# Patient Record
Sex: Female | Born: 1965 | Race: White | Hispanic: No | Marital: Single | Smoking: Former smoker
Health system: Southern US, Community
[De-identification: ages and names within clinical notes are randomized; demographics above are authoritative.]

## PROBLEM LIST (undated history)

## (undated) DIAGNOSIS — F909 Attention-deficit hyperactivity disorder, unspecified type: Secondary | ICD-10-CM

## (undated) DIAGNOSIS — F329 Major depressive disorder, single episode, unspecified: Secondary | ICD-10-CM

## (undated) DIAGNOSIS — F32A Depression, unspecified: Secondary | ICD-10-CM

## (undated) DIAGNOSIS — F319 Bipolar disorder, unspecified: Secondary | ICD-10-CM

## (undated) DIAGNOSIS — Q230 Congenital stenosis of aortic valve: Secondary | ICD-10-CM

## (undated) DIAGNOSIS — N841 Polyp of cervix uteri: Secondary | ICD-10-CM

## (undated) HISTORY — DX: Depression, unspecified: F32.A

## (undated) HISTORY — DX: Attention-deficit hyperactivity disorder, unspecified type: F90.9

## (undated) HISTORY — DX: Major depressive disorder, single episode, unspecified: F32.9

## (undated) HISTORY — PX: CARDIAC VALVE REPLACEMENT: SHX585

## (undated) HISTORY — DX: Polyp of cervix uteri: N84.1

---

## 1996-06-04 HISTORY — PX: CARDIAC VALVE REPLACEMENT: SHX585

## 2011-01-10 ENCOUNTER — Other Ambulatory Visit: Payer: Self-pay | Admitting: Geriatric Medicine

## 2011-01-10 ENCOUNTER — Ambulatory Visit
Admission: RE | Admit: 2011-01-10 | Discharge: 2011-01-10 | Disposition: A | Discharge status: Home / Self Care | Payer: 59 | Source: Ambulatory Visit | Attending: Geriatric Medicine | Admitting: Geriatric Medicine

## 2011-01-10 DIAGNOSIS — M545 Low back pain, unspecified: Secondary | ICD-10-CM

## 2012-09-11 ENCOUNTER — Encounter: Payer: Self-pay | Admitting: Obstetrics and Gynecology

## 2012-09-11 ENCOUNTER — Other Ambulatory Visit (HOSPITAL_COMMUNITY)
Admission: RE | Admit: 2012-09-11 | Discharge: 2012-09-11 | Disposition: A | Discharge status: Home / Self Care | Payer: Self-pay | Source: Ambulatory Visit | Attending: Obstetrics and Gynecology | Admitting: Obstetrics and Gynecology

## 2012-09-11 ENCOUNTER — Ambulatory Visit (INDEPENDENT_AMBULATORY_CARE_PROVIDER_SITE_OTHER): Payer: Self-pay | Admitting: Obstetrics and Gynecology

## 2012-09-11 VITALS — BP 119/70 | HR 88 | Temp 97.5°F | Ht 67.0 in | Wt 169.9 lb

## 2012-09-11 DIAGNOSIS — Z01812 Encounter for preprocedural laboratory examination: Secondary | ICD-10-CM

## 2012-09-11 DIAGNOSIS — N841 Polyp of cervix uteri: Secondary | ICD-10-CM | POA: Insufficient documentation

## 2012-09-11 NOTE — Progress Notes (Signed)
Patient ID: Christina Mccarty, female   DOB: [birth year], 47 y.o.   MRN: [id number] 47 yo presenting today for evaluation of cervical polyp. Patient underwent a free cervical cancer screening where a cervical polyp was visualized. Patient reports irregular cycles where she often skips months. Patient is otherwise without any complaints  GENERAL: Well-developed, well-nourished female in no acute distress.  PELVIC: Normal external female genitalia. Vagina is pink and rugated.  Normal discharge. Normal appearing cervix with 1cm polypoid tissue extending from cervical os. Uterus is normal in size. No adnexal mass or tenderness. EXTREMITIES: No cyanosis, clubbing, or edema, 2+ distal pulses.  A/P 47 yo with cervical polyp  - After informed consent was obtained and with a negative pregnancy test, the patient was placed in dorsalithatomy position and the cervix was prepped with betadine. The cervical polyp was grasped with a ring forceps. The specimen was removed after a series of rotation with traction. The patient tolerated the procedure well. Patient will be contacted with any abnormal results

## 2012-11-28 ENCOUNTER — Telehealth: Payer: Self-pay | Admitting: *Deleted

## 2012-11-28 NOTE — Telephone Encounter (Signed)
Patient left a message stating she had a procedure last April she wants results. Also wants a copy mailed to her.

## 2012-12-04 NOTE — Telephone Encounter (Signed)
Called Christina Mccarty and left a message we are returning her call and will call once more. Per chart review had cervical polyp removed 09/11/12 and per pathology was benign.  Dr. Deretha Emory notes said will call if results abnormal.

## 2012-12-08 ENCOUNTER — Encounter: Payer: Self-pay | Admitting: *Deleted

## 2012-12-08 NOTE — Telephone Encounter (Signed)
Christina Mccarty called and left a message she had a procedure 09/11/12 and has not received any pathology results and this is second time she has called. States someone called her today but she was not available and she called back and was cut off so is leaving a message

## 2012-12-08 NOTE — Telephone Encounter (Signed)
Called Wisdom back and informed her usually we do not call unless results abnormal and that her polyp was benign.  Ramanda requests copy sent to her of polyp results- she gave phone consent to 2 nurses and informed her will send results.

## 2013-02-03 ENCOUNTER — Encounter: Payer: Self-pay | Admitting: *Deleted

## 2016-03-28 ENCOUNTER — Emergency Department: Admission: EM | Admit: 2016-03-28 | Discharge: 2016-03-28 | Discharge status: Against Medical Advice | Payer: Self-pay

## 2016-04-08 ENCOUNTER — Encounter (HOSPITAL_COMMUNITY): Payer: Self-pay

## 2016-04-08 ENCOUNTER — Emergency Department (HOSPITAL_COMMUNITY): Payer: Self-pay

## 2016-04-08 ENCOUNTER — Emergency Department (HOSPITAL_COMMUNITY): Admission: EM | Admit: 2016-04-08 | Discharge: 2016-04-08 | Discharge status: Against Medical Advice | Payer: Self-pay

## 2016-04-08 ENCOUNTER — Emergency Department (HOSPITAL_COMMUNITY)
Admission: EM | Admit: 2016-04-08 | Discharge: 2016-04-09 | Disposition: A | Discharge status: Home / Self Care | Payer: Federal, State, Local not specified - Other | Attending: Emergency Medicine | Admitting: Emergency Medicine

## 2016-04-08 ENCOUNTER — Encounter (HOSPITAL_COMMUNITY): Payer: Self-pay | Admitting: Emergency Medicine

## 2016-04-08 DIAGNOSIS — Z955 Presence of coronary angioplasty implant and graft: Secondary | ICD-10-CM | POA: Insufficient documentation

## 2016-04-08 DIAGNOSIS — F909 Attention-deficit hyperactivity disorder, unspecified type: Secondary | ICD-10-CM | POA: Insufficient documentation

## 2016-04-08 DIAGNOSIS — F319 Bipolar disorder, unspecified: Secondary | ICD-10-CM

## 2016-04-08 DIAGNOSIS — Z79899 Other long term (current) drug therapy: Secondary | ICD-10-CM | POA: Insufficient documentation

## 2016-04-08 DIAGNOSIS — F419 Anxiety disorder, unspecified: Secondary | ICD-10-CM | POA: Insufficient documentation

## 2016-04-08 DIAGNOSIS — Z87891 Personal history of nicotine dependence: Secondary | ICD-10-CM | POA: Insufficient documentation

## 2016-04-08 HISTORY — DX: Bipolar disorder, unspecified: F31.9

## 2016-04-08 LAB — URINALYSIS, ROUTINE W REFLEX MICROSCOPIC
Bilirubin Urine: NEGATIVE
GLUCOSE, UA: NEGATIVE mg/dL
Hgb urine dipstick: NEGATIVE
KETONES UR: 15 mg/dL — AB
Nitrite: NEGATIVE
PH: 5.5 (ref 5.0–8.0)
Protein, ur: NEGATIVE mg/dL
Specific Gravity, Urine: 1.011 (ref 1.005–1.030)

## 2016-04-08 LAB — BASIC METABOLIC PANEL
Anion gap: 13 (ref 5–15)
BUN: 12 mg/dL (ref 6–20)
CALCIUM: 10.1 mg/dL (ref 8.9–10.3)
CO2: 20 mmol/L — ABNORMAL LOW (ref 22–32)
CREATININE: 0.77 mg/dL (ref 0.44–1.00)
Chloride: 105 mmol/L (ref 101–111)
GFR calc Af Amer: >60 mL/min (ref >60)
Glucose, Bld: 115 mg/dL — ABNORMAL HIGH (ref 65–99)
Potassium: 3.2 mmol/L — ABNORMAL LOW (ref 3.5–5.1)
SODIUM: 138 mmol/L (ref 135–145)

## 2016-04-08 LAB — CBC
HCT: 40.3 % (ref 36.0–46.0)
Hemoglobin: 14.1 g/dL (ref 12.0–15.0)
MCH: 31.8 pg (ref 26.0–34.0)
MCHC: 35 g/dL (ref 30.0–36.0)
MCV: 90.8 fL (ref 78.0–100.0)
PLATELETS: 224 10*3/uL (ref 150–400)
RBC: 4.44 MIL/uL (ref 3.87–5.11)
RDW: 12.1 % (ref 11.5–15.5)
WBC: 5.9 10*3/uL (ref 4.0–10.5)

## 2016-04-08 LAB — RAPID URINE DRUG SCREEN, HOSP PERFORMED
Amphetamines: POSITIVE — AB
BARBITURATES: NOT DETECTED
BENZODIAZEPINES: NOT DETECTED
Cocaine: NOT DETECTED
Opiates: NOT DETECTED
Tetrahydrocannabinol: POSITIVE — AB

## 2016-04-08 LAB — URINE MICROSCOPIC-ADD ON: RBC / HPF: NONE SEEN RBC/hpf (ref 0–5)

## 2016-04-08 LAB — I-STAT TROPONIN, ED: TROPONIN I, POC: 0.02 ng/mL (ref 0.00–0.08)

## 2016-04-08 LAB — POC URINE PREG, ED: PREG TEST UR: NEGATIVE

## 2016-04-08 LAB — SALICYLATE LEVEL

## 2016-04-08 LAB — ACETAMINOPHEN LEVEL: Acetaminophen (Tylenol), Serum: <10 ug/mL — ABNORMAL LOW (ref 10–30)

## 2016-04-08 LAB — ETHANOL: Alcohol, Ethyl (B): <5 mg/dL (ref <5)

## 2016-04-08 MED ORDER — FLUOXETINE HCL 20 MG PO CAPS
40.0000 mg | ORAL_CAPSULE | Freq: Every day | ORAL | Status: DC
  Administered 2016-04-09: 40 mg via ORAL
  Filled 2016-04-08 (×2): qty 2

## 2016-04-08 MED ORDER — POTASSIUM CHLORIDE CRYS ER 20 MEQ PO TBCR
40.0000 meq | EXTENDED_RELEASE_TABLET | Freq: Once | ORAL | Status: AC
  Administered 2016-04-08: 40 meq via ORAL
  Filled 2016-04-08: qty 2

## 2016-04-08 MED ORDER — SODIUM CHLORIDE 0.9 % IV BOLUS (SEPSIS)
1000.0000 mL | Freq: Once | INTRAVENOUS | Status: AC
  Administered 2016-04-08: 1000 mL via INTRAVENOUS

## 2016-04-08 MED ORDER — LORAZEPAM 2 MG/ML IJ SOLN
1.0000 mg | Freq: Once | INTRAMUSCULAR | Status: AC
  Administered 2016-04-08: 1 mg via INTRAVENOUS
  Filled 2016-04-08: qty 1

## 2016-04-08 MED ORDER — HALOPERIDOL LACTATE 5 MG/ML IJ SOLN
5.0000 mg | Freq: Once | INTRAMUSCULAR | Status: AC
  Administered 2016-04-08: 5 mg via INTRAVENOUS
  Filled 2016-04-08: qty 1

## 2016-04-08 MED ORDER — AMPHETAMINE-DEXTROAMPHETAMINE 10 MG PO TABS
20.0000 mg | ORAL_TABLET | Freq: Two times a day (BID) | ORAL | Status: DC
  Administered 2016-04-09: 20 mg via ORAL
  Filled 2016-04-08: qty 2

## 2016-04-08 NOTE — ED Notes (Signed)
Pt arrived to C25 via stretcher. Belongings placed at nurses' desk for 1 labeled belongings bag for inventory.

## 2016-04-08 NOTE — Progress Notes (Signed)
CSW completed patient referrals to:  [facility]  [facility] SpeckLeo Jayston [facility] NavarroCSW,LCAS [facility] Disposition CSW [phone]

## 2016-04-08 NOTE — ED Notes (Signed)
Pt awakened approx 30 min ago, used Rest Room without difficulty.  Pt told me she doesn't understand what is happening and why.  Mentioned her sister had her committed.  Asking to speak with an attorney.  Explained we did not have one present in the ED. Stated she lives with her mother, has not been able to find a job since she quit her job "a few years ago"   Is tearful, says she has been having bad nightmares for weeks and that someone is coming in her house and hurting her.

## 2016-04-08 NOTE — ED Notes (Signed)
TTS being attempted.  Pt very sleepy but awakens to voice.  States she is able to perform TTS as requested.  Interviewer up on TTS for pt.

## 2016-04-08 NOTE — ED Notes (Signed)
Dr Ethelda ChickJacubowitz completed 1st exam paperwork for IVC - [facility] to [facility], and original placed in folder for magistrate.

## 2016-04-08 NOTE — ED Notes (Signed)
Pt's sister visiting w/pt. 

## 2016-04-08 NOTE — ED Provider Notes (Signed)
[facility]PT Provider Note   CSN: [id number] Arrival date & time: 04/08/16  0235     History   Chief Complaint Chief Complaint  Patient presents with  . Chest Pain  . Drug Overdose    HPI Christina Mccarty is a 50 y.o. female PMH of ADHD and depression here with acute anxiety.  Patient states she is feeling like someone is going to kill her.  She took oxycodone an adderall trying to treat her symptoms without any relief.  She denies any SI or HI.  No other drugs or alcohol.  She continues to scream out for someone to give her ativan to calm her down.    10 Systems reviewed and are negative for acute change except as noted in the HPI.   HPI  Past Medical History:  Diagnosis Date  . ADHD (attention deficit hyperactivity disorder)   . Cervical polyp   . Depression     Patient Active Problem List   Diagnosis Date Noted  . Polyp at cervical os 09/11/2012    Past Surgical History:  Procedure Laterality Date  . CARDIAC VALVE REPLACEMENT  1998    OB History    No data available       Home Medications    Prior to Admission medications   Medication Sig Start Date End Date Taking? Authorizing Provider  amphetamine-dextroamphetamine (ADDERALL) 20 MG tablet Take 20 mg by mouth 2 (two) times daily.    Historical Provider, MD  FLUoxetine (PROZAC) 40 MG capsule Take 40 mg by mouth daily.    Historical Provider, MD  ibuprofen (ADVIL,MOTRIN) 200 MG tablet Take 200 mg by mouth every 6 (six) hours as needed for pain.    Historical Provider, MD  lisinopril (PRINIVIL,ZESTRIL) 10 MG tablet Take 10 mg by mouth daily.    Historical Provider, MD  loratadine (CLARITIN) 10 MG tablet Take 10 mg by mouth daily.    Historical Provider, MD    Family History Family History  Problem Relation Age of Onset  . Heart disease Father   . Other Father     tumor on thymus removed    Social History Social History  Substance Use Topics  . Smoking status: Former Smoker    Types:  Cigarettes  . Smokeless tobacco: Never Used     Comment: Stopped smoking age 50  . Alcohol use Yes     Comment: 3 glasses daily     Allergies   Patient has no known allergies.   Review of Systems Review of Systems   Physical Exam Updated Vital Signs BP 107/60   Pulse 84   Temp 98 F (36.7 C)   Resp 18   SpO2 97%   Physical Exam  Constitutional: She is oriented to person, place, and time. She appears well-developed and well-nourished. No distress.  HENT:  Head: Normocephalic and atraumatic.  Nose: Nose normal.  Mouth/Throat: Oropharynx is clear and moist. No oropharyngeal exudate.  Eyes: Conjunctivae and EOM are normal. Pupils are equal, round, and reactive to light. No scleral icterus.  Neck: Normal range of motion. Neck supple. No JVD present. No tracheal deviation present. No thyromegaly present.  Cardiovascular: Regular rhythm and normal heart sounds.  Exam reveals no gallop and no friction rub.   No murmur heard. Tachycardic   Pulmonary/Chest: Effort normal and breath sounds normal. No respiratory distress. She has no wheezes. She exhibits no tenderness.  Abdominal: Soft. Bowel sounds are normal. She exhibits no distension and no mass. There is no  tenderness. There is no rebound and no guarding.  Musculoskeletal: Normal range of motion. She exhibits no edema or tenderness.  Lymphadenopathy:    She has no cervical adenopathy.  Neurological: She is alert and oriented to person, place, and time. No cranial nerve deficit. She exhibits normal muscle tone.  Skin: Skin is warm and dry. No rash noted. No erythema. No pallor.  Psychiatric:  Patient is irrational, screaming throughout the ED, demanding ativan.    Nursing note and vitals reviewed.    ED Treatments / Results  Labs (all labs ordered are listed, but only abnormal results are displayed) Labs Reviewed  BASIC METABOLIC PANEL - Abnormal; Notable for the following:       Result Value   Potassium 3.2 (*)     CO2 20 (*)    Glucose, Bld 115 (*)    All other components within normal limits  ACETAMINOPHEN LEVEL - Abnormal; Notable for the following:    Acetaminophen (Tylenol), Serum <10 (*)    All other components within normal limits  URINALYSIS, ROUTINE W REFLEX MICROSCOPIC (NOT AT [facility]) - Abnormal; Notable for the following:    APPearance CLOUDY (*)    Ketones, ur 15 (*)    Leukocytes, UA LARGE (*)    All other components within normal limits  RAPID URINE DRUG SCREEN, HOSP PERFORMED - Abnormal; Notable for the following:    Amphetamines POSITIVE (*)    Tetrahydrocannabinol POSITIVE (*)    All other components within normal limits  URINE MICROSCOPIC-ADD ON - Abnormal; Notable for the following:    Squamous Epithelial / LPF 0-5 (*)    Bacteria, UA RARE (*)    Casts HYALINE CASTS (*)    All other components within normal limits  CBC  SALICYLATE LEVEL  ETHANOL  I-STAT TROPOININ, ED  POC URINE PREG, ED    EKG  EKG Interpretation  Date/Time:  Sunday April 08 2016 03:22:07 EST Ventricular Rate:  127 PR Interval:  182 QRS Duration: 128 QT Interval:  306 QTC Calculation: 444 R Axis:   58 Text Interpretation:  Sinus tachycardia Biatrial enlargement Non-specific intra-ventricular conduction block Nonspecific T wave abnormality Abnormal ECG No significant change since last tracing Confirmed by Erroll Luna [phone]) on 04/08/2016 3:31:11 AM       Radiology Dg Chest Portable 1 View  Result Date: 04/08/2016 CLINICAL DATA:  Chest pain.  Hallucinations.  One day duration. EXAM: PORTABLE CHEST 1 VIEW COMPARISON:  None. FINDINGS: Mild cardiomegaly. The lungs are clear. The pulmonary vasculature is normal. Hilar and mediastinal contours are unremarkable. No large effusions. IMPRESSION: Mild cardiomegaly. Electronically Signed   By: Ellery Plunk M.D.   On: 04/08/2016 04:53    Procedures Procedures (including critical care time)  Medications Ordered in ED Medications  sodium  chloride 0.9 % bolus 1,000 mL (0 mLs Intravenous Stopped 04/08/16 0622)  LORazepam (ATIVAN) injection 1 mg (1 mg Intravenous Given 04/08/16 0406)  haloperidol lactate (HALDOL) injection 5 mg (5 mg Intravenous Given 04/08/16 0406)  potassium chloride SA (K-DUR,KLOR-CON) CR tablet 40 mEq (40 mEq Oral Given 04/08/16 6045)     Initial Impression / Assessment and Plan / ED Course  I have reviewed the triage vital signs and the nursing notes.  Pertinent labs & imaging results that were available during my care of the patient were reviewed by me and considered in my medical decision making (see chart for details).  Clinical Course     Patient presents to the ED for acute psychosis  and anxiety. She was given haldol and ativan as she could not be effectively redirected.  IVf given as well.  Labs pending for medical clearance.  Potassium was replaced.   Patient is medically clear and ready for TTS consultation.  She is safe for move to Pod C as well.  Final Clinical Impressions(s) / ED Diagnoses   Final diagnoses:  None    New Prescriptions New Prescriptions   No medications on file     Tomasita CrumbleAdeleke Cortney Mckinney, MD 04/08/16 [phone]

## 2016-04-08 NOTE — ED Triage Notes (Signed)
Pt comes c/o of CP, pt is very anxious and crying, hyperventilating and scared. States CP has been going on for several week, L sided, pt states that she took some of her moms oxycodone of unknown amount and unknown amount of adderall, because someone has been threating to harm her. Denies SI/HI, pt is delusional and states someone is drugging her.

## 2016-04-08 NOTE — BH Assessment (Addendum)
Tele Assessment Note   Christina Mccarty is a Caucasian 50 y.o. female [facility] by sister) with complaint that people were trying to kill her.  Per hospital notes, Pt complained that someone was trying to kill her, and she demanded Ativan to calm down.  Pt has a history of being treated for depression and Bipolar I.  Information gathered from Pt and Pt's sister.  Pt was groggy when assessed.  She reported that she was confused as to why she was at the hospital.  She admitted that she felt scared and was not sure if she should go home (Pt lives with her mother).  Pt denied suicidal or homicidal ideation, and when asked about auditory/visual hallucination, she shrugged.  Pt said she wanted to go home.  Author contacted Pt's sister, Christina Mccarty [phone]).  Christina Mccarty reported that for the past few weeks, Pt has been off her Lamictal and has been exhibiting increasingly bizarre and alarming behavior:  Recently, Pt has been calling police to complain that people are outside the house and are trying to kill her; per sister, Pt has been responding to internal stimuli and has been saying that a [place] actor is speaking with her; on 04/07/16, Pt intentionally more Adderall than prescribed in stated hope that she would have a heart attack and die.  Christina Mccarty also stated that Pt has struggled to sleep recently, cannot work, and has been taking large quantities of Adderall to calm self.  Christina Mccarty stated that she took Pt to an ED in late October for evaluation but Pt was discharged.  Christina Mccarty also stated that [place] [facility] [place] for similar symptoms.  Pt's UDS indicated the presence of marijuana.  When asked about marijuana use, Pt shrugged.  "I don't know why I'm here."    During assessment, Pt was groggy.  She had fair eye contact.  Demeanor was very guarded.  Pt was dressed in scrubs and appeared appropriately groomed.  Pt denied suicidal ideation; however,  sister stated that Pt intentionally took more Adderall than prescribed on 04/07/16 because she wanted to induce a heart attack.  Pt denied depressive symptoms.  However, Pt's sister stated that Pt has complained of not sleeping for about 1.5 weeks, and that Pt has been self-medicating with Adderall to sleep.  Pt endorsed substance use (UDS was positive for THC and amphetamines).  Pt's speech was slow and soft.  Thought processes were slowed; it may be that Pt was engaging in thought blocking.  Impulse control, insight, and judgment were deemed poor.  While Pt denied symptoms other than a general concern that someone might try to hurt her, Pt's sister reported that Pt has expressed suicidal ideation and has exhibited insomnia, impulsivity (taking more medication than prescribed), interaction with auditory hallucination, and delusion (believing that people are trying to kill her to the point that she has contacted police several times).  Consulted with Christina BurtonL. Parks, NP, who recommended inpatient treatment.  Diagnosis: Bipolar I  Past Medical History:  Past Medical History:  Diagnosis Date  . ADHD (attention deficit hyperactivity disorder)   . Bipolar disorder (HCC)   . Cervical polyp   . Depression     Past Surgical History:  Procedure Laterality Date  . CARDIAC VALVE REPLACEMENT  1998    Family History:  Family History  Problem Relation Age of Onset  . Heart disease Father   . Other Father     tumor  on thymus removed    Social History:  reports that she has quit smoking. Her smoking use included Cigarettes. She has never used smokeless tobacco. She reports that she drinks alcohol. She reports that she uses drugs, including Marijuana.  Additional Social History:     CIWA: CIWA-Ar BP: 107/60 Pulse Rate: 84 COWS:    PATIENT STRENGTHS: (choose at least two) Capable of independent living Supportive family/friends  Allergies: No Known Allergies  Home Medications:  (Not in a hospital  admission)  OB/GYN Status:  No LMP recorded.  General Assessment Data Admission Status: Involuntary           Risk to self with the past 6 months Is patient at risk for suicide?: Yes Substance abuse history and/or treatment for substance abuse?: Yes        Mental Status Report Motor Activity: Freedom of movement     ADLScreening [facility]) Patient's cognitive ability adequate to safely complete daily activities?: Yes Patient able to express need for assistance with ADLs?: Yes Independently performs ADLs?: Yes (appropriate for developmental age)        ADL Screening (condition at time of admission) Patient's cognitive ability adequate to safely complete daily activities?: Yes Is the patient deaf or have difficulty hearing?: No Does the patient have difficulty seeing, even when wearing glasses/contacts?: No Does the patient have difficulty concentrating, remembering, or making decisions?: No Patient able to express need for assistance with ADLs?: Yes Does the patient have difficulty dressing or bathing?: No Independently performs ADLs?: Yes (appropriate for developmental age) Does the patient have difficulty walking or climbing stairs?: No Weakness of Legs: None Weakness of Arms/Hands: None  Home Assistive Devices/Equipment Home Assistive Devices/Equipment: None  Therapy Consults (therapy consults require a physician order) PT Evaluation Needed: No OT Evalulation Needed: No SLP Evaluation Needed: No Abuse/Neglect Assessment (Assessment to be complete while patient is alone) Physical Abuse: Denies Verbal Abuse: Denies Sexual Abuse: Denies Exploitation of patient/patient's resources: Denies Self-Neglect: Denies Values / Beliefs Cultural Requests During Hospitalization: None Spiritual Requests During Hospitalization: None Consults Spiritual Care Consult Needed: No Social Work Consult Needed: No Merchant navy officerAdvance Directives (For Healthcare) Does  patient have an advance directive?: No Would patient like information on creating an advanced directive?: No - patient declined information          Disposition:     Earline Mayotteugene T Kleber Crean 04/08/2016 7:43 AM

## 2016-04-08 NOTE — ED Notes (Signed)
Sister, Antony OdeaLisa Saleh / [phone] ; going to try and visit patient at 5:15

## 2016-04-08 NOTE — ED Notes (Signed)
Bed: [id number] Expected date:  Expected time:  Means of arrival:  Comments: 49yo F

## 2016-04-08 NOTE — ED Notes (Signed)
Pt taking shower.  

## 2016-04-08 NOTE — ED Notes (Signed)
Per EMS- Pt states that she is seeing and hearing voices for x1.She is feeling like someone is going to hurt her or kill her. She said that something happened to her a week ago but did not explain what. Stated that she took handful of hydrocodone and 2 adderall. She is also saying that she has chest pain.

## 2016-04-08 NOTE — ED Notes (Signed)
Report given to Becky R.N.

## 2016-04-09 DIAGNOSIS — Z79899 Other long term (current) drug therapy: Secondary | ICD-10-CM

## 2016-04-09 DIAGNOSIS — Z87891 Personal history of nicotine dependence: Secondary | ICD-10-CM

## 2016-04-09 DIAGNOSIS — F319 Bipolar disorder, unspecified: Secondary | ICD-10-CM

## 2016-04-09 DIAGNOSIS — Z8249 Family history of ischemic heart disease and other diseases of the circulatory system: Secondary | ICD-10-CM

## 2016-04-09 MED ORDER — POTASSIUM CHLORIDE CRYS ER 20 MEQ PO TBCR
40.0000 meq | EXTENDED_RELEASE_TABLET | Freq: Once | ORAL | Status: AC
  Administered 2016-04-09: 40 meq via ORAL
  Filled 2016-04-09: qty 2

## 2016-04-09 NOTE — ED Notes (Signed)
Patient speaking with family on the phone.

## 2016-04-09 NOTE — Discharge Instructions (Signed)
Discontinue taking Adderall and Prozac. Take Lamictal 25 mg daily. Call any of the resources that you're furnished to follow up in order to get counseling. If you have any thought of harming yourself or anyone else, call 911 immediately

## 2016-04-09 NOTE — Progress Notes (Signed)
CSW consulted for transportation assistance. [place] in [place] with her mother with whom does not drive. Patient asked CSW to contact her sister. CSW contacted Patient's sister Misty Stanley(Lisa [phone]) who agreed to contact and pay for a taxi. Patient informed. This CSW signing off. Please contact if new need(s) arise.          Lance MussAshley Gardner,MSW, LCSW [facility]/17M Clinical Social Worker [phone]

## 2016-04-09 NOTE — ED Notes (Signed)
Patient states called her sister and advised she can come get her when she gets off of work.

## 2016-04-09 NOTE — ED Notes (Signed)
MD aware of patient does not meet inpatient treatment.

## 2016-04-09 NOTE — ED Notes (Signed)
Patient walked 3 laps around desk with sitter.

## 2016-04-09 NOTE — ED Notes (Signed)
Diet order has been ordered for lunch. 

## 2016-04-09 NOTE — Consult Note (Signed)
Telepsych Consultation   Reason for Consult: Psychosis, Aggression at home  Referring Physician: EDP Patient Identification: Christina Mccarty MRN:  [id number] Principal Diagnosis: Bipolar 1 disorder, depressed ([place]) Diagnosis:   Patient Active Problem List   Diagnosis Date Noted  . Bipolar 1 disorder, depressed ([place]) [F31.9] 04/09/2016  . Polyp at cervical os [N84.1] 09/11/2012    Total Time spent with patient: 30 minutes  Subjective:   Christina Mccarty is a 50 y.o. female patient admitted voluntarily with sister reporting that paranoid ideation that people were trying to kill her.   HPI:     Per initial Tele Assessment note 04/08/2016:   Christina Mccarty is a Caucasian 50 y.o. female who presented to [facility] on a voluntary basis (transported by sister) with complaint that people were trying to kill her.  Per hospital notes, Pt complained that someone was trying to kill her, and she demanded Ativan to calm down.  Pt has a history of being treated for depression and Bipolar I.  Information gathered from Pt and Pt's sister.  Pt was groggy when assessed.  She reported that she was confused as to why she was at the hospital.  She admitted that she felt scared and was not sure if she should go home (Pt lives with her mother).  Pt denied suicidal or homicidal ideation, and when asked about auditory/visual hallucination, she shrugged.  Pt said she wanted to go home.   Per psychiatric assessment on 04/09/2016. The patient is calm and cooperative. Denies any psychotic symptoms today. Does not appear to be responding to any internal stimuli. Patient reports desire to be re-started on her Lamictal. She denies any current suicidal ideation plan or intent. Denies misuse of her stimulants including taking extra doses. She is agreeable to being seen by an outpatient Psychiatrist that accepts patients that have no insurance. Patient admits to taking "some of my mother's oxycodone. I don't have a job. My family  does not know what to do with me. I have been under some stress. I also ordered a herbal supplement online to help me sleep so I'm not sure if that made my mood worse." Patient's mood was appropriate during the assessment. The patient denied any recent suicidal attempts or current ideation. Patient was advised due to history of Bipolar Disorder to abstain from taking Adderall or Prozac until seen by outpatient provider. Spoke to the EDP about providing patient with two weeks of Lamictal 25 mg daily to assist with mood stabilization. A list of resources will be provided to the patient upon discharge. At this time the patient is not considered to be a danger to self or others. She is stable for discharge with outpatient follow up.   Past Psychiatric History: Bipolar 1   Risk to Self: Suicidal Ideation: Patient denies but taking excess adderall reported by sister  Suicidal Intent: Denies Is patient at risk for suicide?: Yes due to social stressors  Suicidal Plan?: Denies Specify Current Suicidal Plan: Denies What has been your use of drugs/alcohol within the last 12 months?: THC Intentional Self Injurious Behavior: None Risk to Others: Homicidal Ideation: No Thoughts of Harm to Others: No Current Homicidal Intent: No Current Homicidal Plan: No Access to Homicidal Means: No History of harm to others?: No Assessment of Violence: None Noted Does patient have access to weapons?: No Criminal Charges Pending?: No Does patient have a court date: No Prior Inpatient Therapy: Prior Inpatient Therapy: Yes Prior Therapy Dates: April 2017 Prior Therapy Facilty/Provider(s): [facility] Reason for  Treatment: Bipolar Prior Outpatient Therapy: Prior Outpatient Therapy: No (Unknown)  Past Medical History:  Past Medical History:  Diagnosis Date  . ADHD (attention deficit hyperactivity disorder)   . Bipolar disorder (HCC)   . Cervical polyp   . Depression     Past Surgical History:  Procedure Laterality  Date  . CARDIAC VALVE REPLACEMENT  1998   Family History:  Family History  Problem Relation Age of Onset  . Heart disease Father   . Other Father     tumor on thymus removed   Family Psychiatric  History: Non-contributory  Social History:  History  Alcohol Use  . Yes    Comment: 3 glasses daily     History  Drug Use  . Types: Marijuana    Social History   Social History  . Marital status: Single    Spouse name: N/A  . Number of children: N/A  . Years of education: N/A   Social History Main Topics  . Smoking status: Former Smoker    Types: Cigarettes  . Smokeless tobacco: Never Used     Comment: Stopped smoking age 82  . Alcohol use Yes     Comment: 3 glasses daily  . Drug use:     Types: Marijuana  . Sexual activity: Yes    Birth control/ protection: Condom   Other Topics Concern  . None   Social History Narrative  . None   Additional Social History:    Allergies:  No Known Allergies  Labs:  Results for orders placed or performed during the hospital encounter of 04/08/16 (from the past 48 hour(s))  Basic metabolic panel     Status: Abnormal   Collection Time: 04/08/16  3:04 AM  Result Value Ref Range   Sodium 138 135 - 145 mmol/L   Potassium 3.2 (L) 3.5 - 5.1 mmol/L   Chloride 105 101 - 111 mmol/L   CO2 20 (L) 22 - 32 mmol/L   Glucose, Bld 115 (H) 65 - 99 mg/dL   BUN 12 6 - 20 mg/dL   Creatinine, Ser 5.02 0.44 - 1.00 mg/dL   Calcium 71.4 8.9 - 23.2 mg/dL   GFR calc non Af Amer >60 >60 mL/min   GFR calc Af Amer >60 >60 mL/min    Comment: (NOTE) The eGFR has been calculated using the CKD EPI equation. This calculation has not been validated in all clinical situations. eGFR's persistently <60 mL/min signify possible Chronic Kidney Disease.    Anion gap 13 5 - 15  CBC     Status: None   Collection Time: 04/08/16  3:04 AM  Result Value Ref Range   WBC 5.9 4.0 - 10.5 K/uL   RBC 4.44 3.87 - 5.11 MIL/uL   Hemoglobin 14.1 12.0 - 15.0 g/dL   HCT  00.9 41.7 - 91.9 %   MCV 90.8 78.0 - 100.0 fL   MCH 31.8 26.0 - 34.0 pg   MCHC 35.0 30.0 - 36.0 g/dL   RDW 95.7 90.0 - 92.0 %   Platelets 224 150 - 400 K/uL  I-stat troponin, ED     Status: None   Collection Time: 04/08/16  3:13 AM  Result Value Ref Range   Troponin i, poc 0.02 0.00 - 0.08 ng/mL   Comment 3            Comment: Due to the release kinetics of cTnI, a negative result within the first hours of the onset of symptoms does not rule out myocardial infarction  with certainty. If myocardial infarction is still suspected, repeat the test at appropriate intervals.   Salicylate level     Status: None   Collection Time: 04/08/16  3:37 AM  Result Value Ref Range   Salicylate Lvl <5.4 2.8 - 30.0 mg/dL  Acetaminophen level     Status: Abnormal   Collection Time: 04/08/16  3:37 AM  Result Value Ref Range   Acetaminophen (Tylenol), Serum <10 (L) 10 - 30 ug/mL    Comment:        THERAPEUTIC CONCENTRATIONS VARY SIGNIFICANTLY. A RANGE OF 10-30 ug/mL MAY BE AN EFFECTIVE CONCENTRATION FOR MANY PATIENTS. HOWEVER, SOME ARE BEST TREATED AT CONCENTRATIONS OUTSIDE THIS RANGE. ACETAMINOPHEN CONCENTRATIONS >150 ug/mL AT 4 HOURS AFTER INGESTION AND >50 ug/mL AT 12 HOURS AFTER INGESTION ARE OFTEN ASSOCIATED WITH TOXIC REACTIONS.   Urinalysis, Routine w reflex microscopic (not at [facility])     Status: Abnormal   Collection Time: 04/08/16  5:39 AM  Result Value Ref Range   Color, Urine YELLOW YELLOW   APPearance CLOUDY (A) CLEAR   Specific Gravity, Urine 1.011 1.005 - 1.030   pH 5.5 5.0 - 8.0   Glucose, UA NEGATIVE NEGATIVE mg/dL   Hgb urine dipstick NEGATIVE NEGATIVE   Bilirubin Urine NEGATIVE NEGATIVE   Ketones, ur 15 (A) NEGATIVE mg/dL   Protein, ur NEGATIVE NEGATIVE mg/dL   Nitrite NEGATIVE NEGATIVE   Leukocytes, UA LARGE (A) NEGATIVE  Rapid urine drug screen (hospital performed)     Status: Abnormal   Collection Time: 04/08/16  5:39 AM  Result Value Ref Range   Opiates NONE  DETECTED NONE DETECTED   Cocaine NONE DETECTED NONE DETECTED   Benzodiazepines NONE DETECTED NONE DETECTED   Amphetamines POSITIVE (A) NONE DETECTED   Tetrahydrocannabinol POSITIVE (A) NONE DETECTED   Barbiturates NONE DETECTED NONE DETECTED    Comment:        DRUG SCREEN FOR MEDICAL PURPOSES ONLY.  IF CONFIRMATION IS NEEDED FOR ANY PURPOSE, NOTIFY LAB WITHIN 5 DAYS.        LOWEST DETECTABLE LIMITS FOR URINE DRUG SCREEN Drug Class       Cutoff (ng/mL) Amphetamine      1000 Barbiturate      200 Benzodiazepine   656 Tricyclics       812 Opiates          300 Cocaine          300 THC              50   Urine microscopic-add on     Status: Abnormal   Collection Time: 04/08/16  5:39 AM  Result Value Ref Range   Squamous Epithelial / LPF 0-5 (A) NONE SEEN   WBC, UA 6-30 0 - 5 WBC/hpf   RBC / HPF NONE SEEN 0 - 5 RBC/hpf   Bacteria, UA RARE (A) NONE SEEN   Casts HYALINE CASTS (A) NEGATIVE  POC urine preg, ED (not at [facility])     Status: None   Collection Time: 04/08/16  5:45 AM  Result Value Ref Range   Preg Test, Ur NEGATIVE NEGATIVE    Comment:        THE SENSITIVITY OF THIS METHODOLOGY IS >24 mIU/mL   Ethanol     Status: None   Collection Time: 04/08/16  8:10 AM  Result Value Ref Range   Alcohol, Ethyl (B) <5 <5 mg/dL    Comment:        LOWEST DETECTABLE LIMIT FOR SERUM ALCOHOL IS  5 mg/dL FOR MEDICAL PURPOSES ONLY     Current Facility-Administered Medications  Medication Dose Route Frequency Provider Last Rate Last Dose  . amphetamine-dextroamphetamine (ADDERALL) tablet 20 mg  20 mg Oral BID Orlie Dakin, MD   20 mg at 04/09/16 0944  . FLUoxetine (PROZAC) capsule 40 mg  40 mg Oral Daily Orlie Dakin, MD   40 mg at 04/09/16 0354   Current Outpatient Prescriptions  Medication Sig Dispense Refill  . amphetamine-dextroamphetamine (ADDERALL) 20 MG tablet Take 10 mg by mouth every morning.     Marland Kitchen HYDROcodone-acetaminophen (NORCO) 7.5-325 MG tablet Take 1 tablet by mouth  every 6 (six) hours as needed for moderate pain.   0  . ibuprofen (ADVIL,MOTRIN) 200 MG tablet Take 200 mg by mouth every 6 (six) hours as needed for pain.    Marland Kitchen lamoTRIgine (LAMICTAL) 25 MG tablet Take 25 mg by mouth daily.    Marland Kitchen lisinopril (PRINIVIL,ZESTRIL) 10 MG tablet Take 10 mg by mouth daily as needed (only take when taking adderall).     Marland Kitchen loratadine (CLARITIN) 10 MG tablet Take 10 mg by mouth daily.      Musculoskeletal:  Unable to assess via camera   Psychiatric Specialty Exam: Physical Exam  Review of Systems  Psychiatric/Behavioral: Positive for depression and substance abuse (Urine positive for marijuana ). Negative for hallucinations, memory loss and suicidal ideas. The patient is not nervous/anxious and does not have insomnia.     Blood pressure 100/56, pulse 79, temperature 98 F (36.7 C), temperature source Oral, resp. rate 18, SpO2 98 %.There is no height or weight on file to calculate BMI.  General Appearance: Casual  Eye Contact:  Good  Speech:  Clear and Coherent  Volume:  Normal  Mood:  Depressed  Affect:  Congruent  Thought Process:  Coherent and Goal Directed  Orientation:  Full (Time, Place, and Person)  Thought Content:  WDL  Suicidal Thoughts:  No  Homicidal Thoughts:  No  Memory:  Immediate;   Good Recent;   Good Remote;   Good  Judgement:  Fair  Insight:  Present  Psychomotor Activity:  Normal  Concentration:  Concentration: Good and Attention Span: Good  Recall:  Good  Fund of Knowledge:  Good  Language:  Good  Akathisia:  No  Handed:  Right  AIMS (if indicated):     Assets:  Communication Skills Desire for Improvement Leisure Time Physical Health Resilience Social Support  ADL's:  Intact  Cognition:  WNL  Sleep:        Treatment Plan Summary: At this time the patient is stable to follow up outpatient. Discussed case with TTS team. Recommend patient be re-started on Lamictal 25 mg daily for mood stabilization and follow up with  outpatient resources.   Disposition: No evidence of imminent risk to self or others at present.   Patient does not meet criteria for psychiatric inpatient admission. Supportive therapy provided about ongoing stressors. Discussed crisis plan, support from social network, calling 911, coming to the Emergency Department, and calling Suicide Hotline.  Elmarie Shiley, NP 04/09/2016 11:58 AM

## 2016-04-09 NOTE — ED Notes (Signed)
TTS in process,. 

## 2016-04-09 NOTE — ED Notes (Signed)
TTS machine at bedside. 

## 2016-04-09 NOTE — ED Notes (Signed)
Food at bedside.

## 2016-04-09 NOTE — ED Notes (Signed)
Patient was given a snack and drink. 

## 2016-04-09 NOTE — ED Notes (Signed)
Dr. jacubowitz at bedside. 

## 2016-04-09 NOTE — ED Notes (Signed)
[facility]. [facility]  [facility]. 2x2 applied with tape.

## 2016-04-09 NOTE — ED Provider Notes (Addendum)
9:15 AM Sleeping comfortably. Stable.   Doug SouSam Dnasia Gauna, MD 04/09/16 1300 1 PM patient is awake alert pleasant cooperative vehemently denies when to harm herself or anyone else. Case discussed with psychiatry. She's been cleared by psychiatry for discharge home. She is to stop Adderall. Prozac she is instructed to take Lamictal 25 mg daily. Patient reports that she has Lamictal at home. She is given outpatient resources for follow-up. She's been administered oral potassium while here Results for orders placed or performed during the hospital encounter of 04/08/16  Basic metabolic panel  Result Value Ref Range   Sodium 138 135 - 145 mmol/L   Potassium 3.2 (L) 3.5 - 5.1 mmol/L   Chloride 105 101 - 111 mmol/L   CO2 20 (L) 22 - 32 mmol/L   Glucose, Bld 115 (H) 65 - 99 mg/dL   BUN 12 6 - 20 mg/dL   Creatinine, Ser 4.090.77 0.44 - 1.00 mg/dL   Calcium 81.110.1 8.9 - 91.410.3 mg/dL   GFR calc non Af Amer >60 >60 mL/min   GFR calc Af Amer >60 >60 mL/min   Anion gap 13 5 - 15  CBC  Result Value Ref Range   WBC 5.9 4.0 - 10.5 K/uL   RBC 4.44 3.87 - 5.11 MIL/uL   Hemoglobin 14.1 12.0 - 15.0 g/dL   HCT 78.240.3 95.636.0 - 21.346.0 %   MCV 90.8 78.0 - 100.0 fL   MCH 31.8 26.0 - 34.0 pg   MCHC 35.0 30.0 - 36.0 g/dL   RDW 08.612.1 57.811.5 - 46.915.5 %   Platelets 224 150 - 400 K/uL  Salicylate level  Result Value Ref Range   Salicylate Lvl <7.0 2.8 - 30.0 mg/dL  Acetaminophen level  Result Value Ref Range   Acetaminophen (Tylenol), Serum <10 (L) 10 - 30 ug/mL  Urinalysis, Routine w reflex microscopic ([facility])  Result Value Ref Range   Color, Urine YELLOW YELLOW   APPearance CLOUDY (A) CLEAR   Specific Gravity, Urine 1.011 1.005 - 1.030   pH 5.5 5.0 - 8.0   Glucose, UA NEGATIVE NEGATIVE mg/dL   Hgb urine dipstick NEGATIVE NEGATIVE   Bilirubin Urine NEGATIVE NEGATIVE   Ketones, ur 15 (A) NEGATIVE mg/dL   Protein, ur NEGATIVE NEGATIVE mg/dL   Nitrite NEGATIVE NEGATIVE   Leukocytes, UA LARGE (A) NEGATIVE  Rapid urine  drug screen (hospital performed)  Result Value Ref Range   Opiates NONE DETECTED NONE DETECTED   Cocaine NONE DETECTED NONE DETECTED   Benzodiazepines NONE DETECTED NONE DETECTED   Amphetamines POSITIVE (A) NONE DETECTED   Tetrahydrocannabinol POSITIVE (A) NONE DETECTED   Barbiturates NONE DETECTED NONE DETECTED  Ethanol  Result Value Ref Range   Alcohol, Ethyl (B) <5 <5 mg/dL  Urine microscopic-add on  Result Value Ref Range   Squamous Epithelial / LPF 0-5 (A) NONE SEEN   WBC, UA 6-30 0 - 5 WBC/hpf   RBC / HPF NONE SEEN 0 - 5 RBC/hpf   Bacteria, UA RARE (A) NONE SEEN   Casts HYALINE CASTS (A) NEGATIVE  I-stat troponin, ED  Result Value Ref Range   Troponin i, poc 0.02 0.00 - 0.08 ng/mL   Comment 3          POC urine preg[facility])  Result Value Ref Range   Preg Test, Ur NEGATIVE NEGATIVE   Dg Chest Portable 1 View  Result Date: 04/08/2016 CLINICAL DATA:  Chest pain.  Hallucinations.  One day duration. EXAM: PORTABLE CHEST 1  VIEW COMPARISON:  None. FINDINGS: Mild cardiomegaly. The lungs are clear. The pulmonary vasculature is normal. Hilar and mediastinal contours are unremarkable. No large effusions. IMPRESSION: Mild cardiomegaly. Electronically Signed   By: Ellery Plunkaniel R Mitchell M.D.   On: 04/08/2016 04:53     Doug SouSam Findlay Dagher, MD 04/09/16 1304    Doug SouSam Hristopher Missildine, MD 04/09/16 1304

## 2016-04-09 NOTE — ED Notes (Signed)
Patient just received breakfast. 

## 2016-04-19 ENCOUNTER — Encounter (HOSPITAL_COMMUNITY): Payer: Self-pay

## 2016-04-19 ENCOUNTER — Inpatient Hospital Stay (HOSPITAL_COMMUNITY)
Admission: AD | Admit: 2016-04-19 | Discharge: 2016-04-24 | DRG: 885 | Disposition: A | Discharge status: Home / Self Care | Payer: Federal, State, Local not specified - Other | Attending: Psychiatry | Admitting: Psychiatry

## 2016-04-19 DIAGNOSIS — F333 Major depressive disorder, recurrent, severe with psychotic symptoms: Secondary | ICD-10-CM | POA: Diagnosis present

## 2016-04-19 DIAGNOSIS — Z79899 Other long term (current) drug therapy: Secondary | ICD-10-CM | POA: Diagnosis not present

## 2016-04-19 DIAGNOSIS — F1910 Other psychoactive substance abuse, uncomplicated: Secondary | ICD-10-CM | POA: Insufficient documentation

## 2016-04-19 DIAGNOSIS — Z87891 Personal history of nicotine dependence: Secondary | ICD-10-CM | POA: Diagnosis not present

## 2016-04-19 DIAGNOSIS — F909 Attention-deficit hyperactivity disorder, unspecified type: Secondary | ICD-10-CM | POA: Diagnosis present

## 2016-04-19 DIAGNOSIS — Z9889 Other specified postprocedural states: Secondary | ICD-10-CM | POA: Diagnosis not present

## 2016-04-19 DIAGNOSIS — X789XXA Intentional self-harm by unspecified sharp object, initial encounter: Secondary | ICD-10-CM | POA: Diagnosis present

## 2016-04-19 DIAGNOSIS — Q230 Congenital stenosis of aortic valve: Secondary | ICD-10-CM

## 2016-04-19 DIAGNOSIS — F319 Bipolar disorder, unspecified: Secondary | ICD-10-CM | POA: Diagnosis present

## 2016-04-19 DIAGNOSIS — Z8489 Family history of other specified conditions: Secondary | ICD-10-CM | POA: Diagnosis not present

## 2016-04-19 DIAGNOSIS — S1191XA Laceration without foreign body of unspecified part of neck, initial encounter: Secondary | ICD-10-CM | POA: Diagnosis present

## 2016-04-19 DIAGNOSIS — F1220 Cannabis dependence, uncomplicated: Secondary | ICD-10-CM | POA: Diagnosis present

## 2016-04-19 DIAGNOSIS — F15959 Other stimulant use, unspecified with stimulant-induced psychotic disorder, unspecified: Secondary | ICD-10-CM | POA: Diagnosis present

## 2016-04-19 DIAGNOSIS — Z952 Presence of prosthetic heart valve: Secondary | ICD-10-CM | POA: Diagnosis not present

## 2016-04-19 DIAGNOSIS — F3189 Other bipolar disorder: Secondary | ICD-10-CM | POA: Diagnosis present

## 2016-04-19 DIAGNOSIS — S41119A Laceration without foreign body of unspecified upper arm, initial encounter: Secondary | ICD-10-CM | POA: Diagnosis present

## 2016-04-19 DIAGNOSIS — F1520 Other stimulant dependence, uncomplicated: Secondary | ICD-10-CM | POA: Diagnosis present

## 2016-04-19 DIAGNOSIS — Z8249 Family history of ischemic heart disease and other diseases of the circulatory system: Secondary | ICD-10-CM | POA: Diagnosis not present

## 2016-04-19 DIAGNOSIS — Y92007 Garden or yard of unspecified non-institutional (private) residence as the place of occurrence of the external cause: Secondary | ICD-10-CM

## 2016-04-19 DIAGNOSIS — F1999 Other psychoactive substance use, unspecified with unspecified psychoactive substance-induced disorder: Secondary | ICD-10-CM | POA: Insufficient documentation

## 2016-04-19 HISTORY — DX: Congenital stenosis of aortic valve: Q23.0

## 2016-04-19 MED ORDER — ACETAMINOPHEN 325 MG PO TABS: 650.0000 mg | ORAL_TABLET | Freq: Four times a day (QID) | ORAL | Status: DC | PRN

## 2016-04-19 MED ORDER — LORAZEPAM 1 MG PO TABS
1.0000 mg | ORAL_TABLET | Freq: Four times a day (QID) | ORAL | Status: DC | PRN
  Administered 2016-04-19 – 2016-04-21 (×2): 1 mg via ORAL
  Filled 2016-04-19 (×2): qty 1

## 2016-04-19 MED ORDER — LAMOTRIGINE 25 MG PO TABS
25.0000 mg | ORAL_TABLET | Freq: Every day | ORAL | Status: DC
  Administered 2016-04-20 – 2016-04-24 (×5): 25 mg via ORAL
  Filled 2016-04-19 (×6): qty 1
  Filled 2016-04-19: qty 7
  Filled 2016-04-19: qty 1

## 2016-04-19 MED ORDER — MAGNESIUM HYDROXIDE 400 MG/5ML PO SUSP: 30.0000 mL | Freq: Every day | ORAL | Status: DC | PRN

## 2016-04-19 MED ORDER — LORATADINE 10 MG PO TABS
10.0000 mg | ORAL_TABLET | Freq: Every day | ORAL | Status: DC
  Administered 2016-04-20 – 2016-04-24 (×5): 10 mg via ORAL
  Filled 2016-04-19: qty 7
  Filled 2016-04-19 (×7): qty 1

## 2016-04-19 MED ORDER — ALUM & MAG HYDROXIDE-SIMETH 200-200-20 MG/5ML PO SUSP
30.0000 mL | ORAL | Status: DC | PRN
  Administered 2016-04-20 – 2016-04-23 (×3): 30 mL via ORAL
  Filled 2016-04-19 (×3): qty 30

## 2016-04-19 MED ORDER — IBUPROFEN 600 MG PO TABS: 600.0000 mg | ORAL_TABLET | Freq: Four times a day (QID) | ORAL | Status: DC | PRN

## 2016-04-19 MED ORDER — LOPERAMIDE HCL 2 MG PO CAPS: 2.0000 mg | ORAL_CAPSULE | ORAL | Status: DC | PRN

## 2016-04-19 MED ORDER — HYDROXYZINE HCL 25 MG PO TABS
ORAL_TABLET | ORAL | Status: AC
  Filled 2016-04-19: qty 1

## 2016-04-19 MED ORDER — HYDROXYZINE HCL 25 MG PO TABS
25.0000 mg | ORAL_TABLET | Freq: Four times a day (QID) | ORAL | Status: DC | PRN
  Administered 2016-04-19: 25 mg via ORAL
  Filled 2016-04-19: qty 10

## 2016-04-19 MED ORDER — ONDANSETRON 4 MG PO TBDP: 4.0000 mg | ORAL_TABLET | Freq: Four times a day (QID) | ORAL | Status: DC | PRN

## 2016-04-19 MED ORDER — TRAZODONE HCL 50 MG PO TABS
ORAL_TABLET | ORAL | Status: AC
  Filled 2016-04-19: qty 1

## 2016-04-19 MED ORDER — TRAZODONE HCL 50 MG PO TABS
50.0000 mg | ORAL_TABLET | Freq: Every evening | ORAL | Status: DC | PRN
  Administered 2016-04-19: 50 mg via ORAL

## 2016-04-19 MED ORDER — METHOCARBAMOL 500 MG PO TABS: 500.0000 mg | ORAL_TABLET | Freq: Three times a day (TID) | ORAL | Status: DC | PRN

## 2016-04-19 MED ORDER — DICYCLOMINE HCL 20 MG PO TABS
20.0000 mg | ORAL_TABLET | Freq: Four times a day (QID) | ORAL | Status: DC | PRN
  Administered 2016-04-21 – 2016-04-24 (×2): 20 mg via ORAL
  Filled 2016-04-19 (×2): qty 1

## 2016-04-19 MED ORDER — OLANZAPINE 5 MG PO TBDP
5.0000 mg | ORAL_TABLET | Freq: Once | ORAL | Status: AC
  Administered 2016-04-19: 5 mg via ORAL
  Filled 2016-04-19 (×2): qty 1

## 2016-04-19 NOTE — BH Assessment (Signed)
Tele Assessment Note   Christina Mccarty is an 50 y.o. female who [facility]. All information gathered is from Christina Mccarty's assessment.  Per Telepsych assessment, "Pt presented to the ED with delusional and bizarre behaviors. According to reports, one of the pt's neighbors called 911 due to seeing the pt wandering around the yard bleeding from her neck. The ptr began to experience rambling thoughts and confusion. Pt stated that she did not know why she was in the hospital or where she was proior to coming to the hospital. Pt was often tearful during the assessment and stated, "I live with my mom, but she is not my mom because I do not recognize her voice".Christina ConnJason Berry, FNP reccommends IP treatment.   Please see [facility]'s assessment for more information (paper in chart). On 04/19/16, pt was reassessed by writer, and was determined to meet criteria for [address] when pt explained she was using meth, marijuana and Adderrall when the symptoms started. She states she has not had hallucinations before a few weeks ago.  [facility] Per Christina RainwaterLindsey, AC, pt coming to 301[address]1 by law enforcement.     Diagnosis: Substance Abuse Disorder, substance induced psychosis, MDD, severe, recurrent.  Past Medical History:  Past Medical History:  Diagnosis Date  . ADHD (attention deficit hyperactivity disorder)   . Bipolar disorder (HCC)   . Cervical polyp   . Depression     Past Surgical History:  Procedure Laterality Date  . CARDIAC VALVE REPLACEMENT  1998    Family History:  Family History  Problem Relation Age of Onset  . Heart disease Father   . Other Father     tumor on thymus removed    Social History:  reports that she has quit smoking. Her smoking use included Cigarettes. She has never used smokeless tobacco. She reports that she drinks alcohol. She reports that she uses drugs, including Marijuana.  Additional Social History:  Alcohol / Drug Use Pain Medications:  denies Prescriptions: denies Over the Counter: denies History of alcohol / drug use?: Yes (hx of marijuana, meth, adderall, amphetemine) Longest period of sobriety (when/how long): unk Withdrawal Symptoms:  (denies)  CIWA:   COWS:    PATIENT STRENGTHS: (choose at least two) Ability for insight Capable of independent living Communication skills Supportive family/friends  Allergies: No Known Allergies  Home Medications:  (Not in a hospital admission)  OB/GYN Status:  No LMP recorded.  General Assessment Data Location of Assessment: [facility] Assessment Services Christina Mccarty([place]) TTS Assessment: Out of system Is this a Tele or Face-to-Face Assessment?: Tele Assessment Is this an Initial Assessment or a Re-assessment for this encounter?: Initial Assessment Marital status: Single Is patient pregnant?: No Pregnancy Status: No Living Arrangements: Parent Can pt return to current living arrangement?: Yes Admission Status: Involuntary Is patient capable of signing voluntary admission?: Yes Referral Source:  Christina Mccarty([place]) Insurance type: MCD  Medical Screening Exam [facility] Walk-in ONLY) Reason for MSE not completed: Other:  Crisis Care Plan Living Arrangements: Parent Name of Psychiatrist:  (none) Name of Therapist: none  Education Status Is patient currently in school?: No  Risk to self with the past 6 months Suicidal Ideation: Yes-Currently Present Has patient been a risk to self within the past 6 months prior to admission? :  (cut neck on admission) Suicidal Intent: Yes-Currently Present Has patient had any suicidal intent within the past 6 months prior to admission? : Yes Is patient at risk for suicide?: Yes Suicidal Plan?: Yes-Currently Present Has patient  had any suicidal plan within the past 6 months prior to admission? : Yes Specify Current Suicidal Plan: yes (OD on Adderall to have a heartattack) Access to Means: Yes Specify Access to Suicidal Means: environment What has been  your use of drugs/alcohol within the last 12 months?: see Sa section Previous Attempts/Gestures: Yes How many times?:  (unk) Triggers for Past Attempts: Unpredictable Intentional Self Injurious Behavior: None Family Suicide History: Unknown Recent stressful life event(s):  (not taking meds, few supports) Persecutory voices/beliefs?: Yes Depression: Yes Depression Symptoms: Isolating, Feeling angry/irritable, Feeling worthless/self pity, Insomnia, Tearfulness Substance abuse history and/or treatment for substance abuse?: Yes Suicide prevention information given to non-admitted patients: Not applicable  Risk to Others within the past 6 months Homicidal Ideation: No Does patient have any lifetime risk of violence toward others beyond the six months prior to admission? : No Thoughts of Harm to Others: No Current Homicidal Intent: No Current Homicidal Plan: No Access to Homicidal Means: No History of harm to others?: No Assessment of Violence: None Noted Does patient have access to weapons?: No Criminal Charges Pending?: No Does patient have a court date: No Is patient on probation?: No  Psychosis Hallucinations: Auditory (Persecutory) Delusions: Persecutory  Mental Status Report Appearance/Hygiene: In scrubs Eye Contact: Poor Motor Activity: Restlessness Speech: Unremarkable Level of Consciousness: Irritable, Drowsy, Restless Mood: Depressed, Irritable, Anxious Affect: Apprehensive Anxiety Level: Moderate Thought Processes: Tangential, Flight of Ideas Judgement: Impaired Orientation: Not oriented Obsessive Compulsive Thoughts/Behaviors: Minimal  Cognitive Functioning Concentration: Decreased Memory: Recent Impaired, Remote Intact IQ: Average Insight: Poor Impulse Control: Poor Appetite:  (UTA) Weight Loss:  (UTA) Weight Gain:  (UTA) Sleep: Decreased Total Hours of Sleep:  (unk) Vegetative Symptoms: Decreased grooming  ADLScreening [facility] Assessment  Services) Patient's cognitive ability adequate to safely complete daily activities?: Yes Patient able to express need for assistance with ADLs?: Yes Independently performs ADLs?: Yes (appropriate for developmental age)  Prior Inpatient Therapy Prior Inpatient Therapy: Yes Prior Therapy Dates: April 2017 [facility] Reason for Treatment: Bipolar  Prior Outpatient Therapy Prior Outpatient Therapy: No Does patient have an ACCT team?: No Does patient have Intensive In-House Services?  : No Does patient have [facility] services? : No Does patient have P4CC services?: No  ADL Screening (condition at time of admission) Patient's cognitive ability adequate to safely complete daily activities?: Yes Is the patient deaf or have difficulty hearing?: No Does the patient have difficulty seeing, even when wearing glasses/contacts?: No Does the patient have difficulty concentrating, remembering, or making decisions?: No Patient able to express need for assistance with ADLs?: Yes Does the patient have difficulty dressing or bathing?: No Independently performs ADLs?: Yes (appropriate for developmental age) Does the patient have difficulty walking or climbing stairs?: No Weakness of Legs: None Weakness of Arms/Hands: None  Home Assistive Devices/Equipment Home Assistive Devices/Equipment: None    Abuse/Neglect Assessment (Assessment to be complete while patient is alone) Physical Abuse: Denies Verbal Abuse: Denies Sexual Abuse: Denies Exploitation of patient/patient's resources: Denies Self-Neglect: Denies Values / Beliefs Cultural Requests During Hospitalization: None Spiritual Requests During Hospitalization: None   Advance Directives (For Healthcare) Does patient have an advance directive?: No Would patient like information on creating an advanced directive?: No - patient declined information    Additional Information 1:1 In Past 12 Months?: No CIRT  Risk: No Elopement Risk: No Does patient have medical clearance?: Yes     Disposition:  Disposition Initial Assessment Completed for this Encounter: Yes Disposition of Patient: Inpatient treatment program  Type of inpatient treatment program: Adult  Theo Dills 04/19/2016 5:39 PM

## 2016-04-19 NOTE — Progress Notes (Signed)
Psychoeducational Group Note  Date:  04/19/2016 Time:  2327  Group Topic/Focus:  Wrap-Up Group:   The focus of this group is to help patients review their daily goal of treatment and discuss progress on daily workbooks.   Participation Level: Did Not Attend  Participation Quality:  Not Applicable  Affect:  Not Applicable  Cognitive:  Not Applicable  Insight:  Not Applicable  Engagement in Group: Not Applicable  Additional Comments:  The patient did not attend group this evening since she was admitted after the session concluded.   Hazle CocaGOODMAN, Ibraheem Voris S 04/19/2016, 11:27 PM

## 2016-04-19 NOTE — Progress Notes (Signed)
Christina BradfordKimberly is a 50 year old female being admitted involuntarily to [facility].  She presented to the ED with delusional and bizarre behaviors.  One of the patients neighbors called 911 due to seeing the pt wandering around the yard bleeding from her neck. She was bizarre, talking about people trying to get her and felt like people were in her house.  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  She has been taking her mother's oxycodone 7.5 or 10mg  tablets, at least two pills per day, since 2011.  She reported an increase in her depression since taking care of her mother.  She denies any SI/HI or A/V halluciantions at this time.  She did report that prior to coming to the hospital she felt like someone was moving things in her house, her cat was telling her to kill her family and her self and "I tried to kill myself so I wouldn't hurt anyone in my family."  She was very tearful during the admission.  She continues to voice depression, hopelessness and helplessness.  She does deny SI/HI or A/V hallucinations at this time.  She is diagnosed with Substance Abuse Disorder, substance induced psychosis, MDD, severe, recurrent.  When she was brought on the unit, she became upset and paranoid.  "Am I going to be safe here, is anyone going to get me while I am here."  She started crying uncontrollably.  Snack and drink provided as well as reassurance that she would be safe on the unit.  Oriented her to the unit.  Admission paperwork completed and signed.  Belongings searched and secured in locker # 30.  Skin assessment completed and noted multiple scars on her upper chest (from 3 different heart surgeries), self inflicted superficial cuts to neck and self inflicted laceration to left forearm (stitches intact, dry and no redness noted).  New dressing placed.  Q 15 minute checks initiated for safety.  We will monitor the progress towards her goals.

## 2016-04-19 NOTE — Tx Team (Signed)
Initial Treatment Plan 04/19/2016 9:59 PM Christina SaneKimberly Jowers ZOX:[id number]    PATIENT STRESSORS: Financial difficulties Health problems Marital or family conflict Occupational concerns Substance abuse Other: Taking care of her elderly mother with dementia   PATIENT STRENGTHS: Average or above average intelligence Communication skills General fund of knowledge   PATIENT IDENTIFIED PROBLEMS: Depression  Suicidal ideation  Anxiety  Paranoia/psychosis  Substance abuse  "I just want my life to get back to normal"  "Live somewhere with my cats and have a job"         DISCHARGE CRITERIA:  Improved stabilization in mood, thinking, and/or behavior Need for constant or close observation no longer present Reduction of life-threatening or endangering symptoms to within safe limits Verbal commitment to aftercare and medication compliance Withdrawal symptoms are absent or subacute and managed without 24-hour nursing intervention  PRELIMINARY DISCHARGE PLAN: Participate in family therapy Medication management  PATIENT/FAMILY INVOLVEMENT: This treatment plan has been presented to and reviewed with the patient, Christina Mccarty.  The patient and family have been given the opportunity to ask questions and make suggestions.  Levin BaconHeather V Ahsan Esterline, RN 04/19/2016, 9:59 PM

## 2016-04-20 DIAGNOSIS — Z8489 Family history of other specified conditions: Secondary | ICD-10-CM

## 2016-04-20 DIAGNOSIS — Z8249 Family history of ischemic heart disease and other diseases of the circulatory system: Secondary | ICD-10-CM

## 2016-04-20 DIAGNOSIS — Z9889 Other specified postprocedural states: Secondary | ICD-10-CM

## 2016-04-20 DIAGNOSIS — Z79899 Other long term (current) drug therapy: Secondary | ICD-10-CM

## 2016-04-20 DIAGNOSIS — F1520 Other stimulant dependence, uncomplicated: Secondary | ICD-10-CM

## 2016-04-20 DIAGNOSIS — F319 Bipolar disorder, unspecified: Secondary | ICD-10-CM

## 2016-04-20 DIAGNOSIS — F1220 Cannabis dependence, uncomplicated: Secondary | ICD-10-CM

## 2016-04-20 MED ORDER — QUETIAPINE FUMARATE 25 MG PO TABS
25.0000 mg | ORAL_TABLET | Freq: Four times a day (QID) | ORAL | Status: DC | PRN
  Filled 2016-04-20 (×3): qty 10

## 2016-04-20 MED ORDER — QUETIAPINE FUMARATE 100 MG PO TABS
100.0000 mg | ORAL_TABLET | Freq: Every day | ORAL | Status: DC
  Administered 2016-04-20: 100 mg via ORAL
  Filled 2016-04-20 (×2): qty 1

## 2016-04-20 NOTE — Progress Notes (Signed)
Nursing Note 04/20/2016 [id number]  Data Reports sleeping good without PRN sleep med.  Rates depression 8/10, hopelessness 8/10, and anxiety 8/10. Affect apprehensive.  Observed in milieu keeping to self, looking around.  Very minimal with staff, does not interact with peers.  Denies HI, SI, AVH.  Cut on left forearm well approximated, stitches intact.  Mild erythema observed, no swelling or drainage.  Action Spoke with patient 1:1, nurse offered support to patient throughout shift.  Cut on left arm covered with dry non-adherent dressing for modesty "it looks awful."  Continues to be monitored on 15 minute checks for safety.  Response Remains safe on unit though very minimal and guarded in milieu.

## 2016-04-20 NOTE — Progress Notes (Signed)
Recreation Therapy Notes  Date: 04/20/16 Time: 0930 Location: [address] Hall Dayroom  Group Topic: Stress Management  Goal Area(s) Addresses:  Patient will verbalize importance of using healthy stress management.  Patient will identify positive emotions associated with healthy stress management.   Behavioral Response: Engaged  Intervention: Stress Management  Activity :  Floating on a Cloud.  LRT introduced the stress management technique of guided imagery.  LRT read a script to allow patients to engage in the technique.  Patients were to follow along as LRT read script.  Education:  Stress Management, Discharge Planning.   Education Outcome: Acknowledges edcuation/In group clarification offered/Needs additional education  Clinical Observations/Feedback: Pt attended group.   Caroll RancherMarjette Bassy Fetterly, LRT/CTRS      Caroll RancherLindsay, Kenith Trickel A 04/20/2016 12:16 PM

## 2016-04-20 NOTE — Tx Team (Signed)
Interdisciplinary Treatment and Diagnostic Plan Update  04/20/2016 Time of Session: 9:30AM Christina Mccarty MRN: [id number]  Principal Diagnosis: MDD (major depressive disorder), recurrent, severe, with psychosis (HCC)  Secondary Diagnoses: Principal Problem:   MDD (major depressive disorder), recurrent, severe, with psychosis (HCC) Active Problems:   Polysubstance abuse   Current Medications:  Current Facility-Administered Medications  Medication Dose Route Frequency Provider Last Rate Last Dose  . acetaminophen (TYLENOL) tablet 650 mg  650 mg Oral Q6H PRN Laveda AbbeLaurie Britton Parks, NP      . alum & mag hydroxide-simeth (MAALOX/MYLANTA) 200-200-20 MG/5ML suspension 30 mL  30 mL Oral Q4H PRN Laveda AbbeLaurie Britton Parks, NP      . dicyclomine (BENTYL) tablet 20 mg  20 mg Oral Q6H PRN Jackelyn PolingJason A Berry, NP      . hydrOXYzine (ATARAX/VISTARIL) tablet 25 mg  25 mg Oral Q6H PRN Laveda AbbeLaurie Britton Parks, NP   25 mg at 04/19/16 2211  . ibuprofen (ADVIL,MOTRIN) tablet 600 mg  600 mg Oral Q6H PRN Jackelyn PolingJason A Berry, NP      . lamoTRIgine (LAMICTAL) tablet 25 mg  25 mg Oral Daily Jackelyn PolingJason A Berry, NP   25 mg at 04/20/16 0821  . loperamide (IMODIUM) capsule 2-4 mg  2-4 mg Oral PRN Jackelyn PolingJason A Berry, NP      . loratadine (CLARITIN) tablet 10 mg  10 mg Oral Daily Jackelyn PolingJason A Berry, NP   10 mg at 04/20/16 0820  . LORazepam (ATIVAN) tablet 1 mg  1 mg Oral Q6H PRN Jackelyn PolingJason A Berry, NP   1 mg at 04/19/16 2306  . magnesium hydroxide (MILK OF MAGNESIA) suspension 30 mL  30 mL Oral Daily PRN Laveda AbbeLaurie Britton Parks, NP      . methocarbamol (ROBAXIN) tablet 500 mg  500 mg Oral Q8H PRN Jackelyn PolingJason A Berry, NP      . ondansetron (ZOFRAN-ODT) disintegrating tablet 4 mg  4 mg Oral Q6H PRN Jackelyn PolingJason A Berry, NP      . traZODone (DESYREL) tablet 50 mg  50 mg Oral QHS PRN Laveda AbbeLaurie Britton Parks, NP   50 mg at 04/19/16 2211   PTA Medications: Prescriptions Prior to Admission  Medication Sig Dispense Refill Last Dose  . amphetamine-dextroamphetamine (ADDERALL) 20 MG  tablet Take 10 mg by mouth every morning.    Unknown at Unknown  . HYDROcodone-acetaminophen (NORCO) 7.5-325 MG tablet Take 1 tablet by mouth every 6 (six) hours as needed for moderate pain.   0 Unknown at Unknown  . ibuprofen (ADVIL,MOTRIN) 200 MG tablet Take 200 mg by mouth every 6 (six) hours as needed for pain.   Unknown at Unknown  . lamoTRIgine (LAMICTAL) 25 MG tablet Take 25 mg by mouth daily.   Past Week at Unknown time  . lisinopril (PRINIVIL,ZESTRIL) 10 MG tablet Take 10 mg by mouth daily as needed (only take when taking adderall).    Past Month at Unknown time  . loratadine (CLARITIN) 10 MG tablet Take 10 mg by mouth daily.   Unknown at Unknown    Patient Stressors: Financial difficulties Health problems Marital or family conflict Occupational concerns Substance abuse Other: Taking care of her elderly mother with dementia  Patient Strengths: Average or above average intelligence Wellsite geologistCommunication skills General fund of knowledge  Treatment Modalities: Medication Management, Group therapy, Case management,  1 to 1 session with clinician, Psychoeducation, Recreational therapy.   Physician Treatment Plan for Primary Diagnosis: MDD (major depressive disorder), recurrent, severe, with psychosis (HCC) Long Term Goal(s):     Short  Term Goals:    Medication Management: Evaluate patient's response, side effects, and tolerance of medication regimen.  Therapeutic Interventions: 1 to 1 sessions, Unit Group sessions and Medication administration.  Evaluation of Outcomes: Progressing  Physician Treatment Plan for Secondary Diagnosis: Principal Problem:   MDD (major depressive disorder), recurrent, severe, with psychosis (HCC) Active Problems:   Polysubstance abuse  Long Term Goal(s):     Short Term Goals:       Medication Management: Evaluate patient's response, side effects, and tolerance of medication regimen.  Therapeutic Interventions: 1 to 1 sessions, Unit Group sessions  and Medication administration.  Evaluation of Outcomes: Progressing   RN Treatment Plan for Primary Diagnosis: MDD (major depressive disorder), recurrent, severe, with psychosis (HCC) Long Term Goal(s): Knowledge of disease and therapeutic regimen to maintain health will improve  Short Term Goals: Ability to remain free from injury will improve, Ability to disclose and discuss suicidal ideas and Ability to identify and develop effective coping behaviors will improve  Medication Management: RN will administer medications as ordered by provider, will assess and evaluate patient's response and provide education to patient for prescribed medication. RN will report any adverse and/or side effects to prescribing provider.  Therapeutic Interventions: 1 on 1 counseling sessions, Psychoeducation, Medication administration, Evaluate responses to treatment, Monitor vital signs and CBGs as ordered, Perform/monitor CIWA, COWS, AIMS and Fall Risk screenings as ordered, Perform wound care treatments as ordered.  Evaluation of Outcomes: Progressing   LCSW Treatment Plan for Primary Diagnosis: MDD (major depressive disorder), recurrent, severe, with psychosis (HCC) Long Term Goal(s): Safe transition to appropriate next level of care at discharge, Engage patient in therapeutic group addressing interpersonal concerns.  Short Term Goals: Engage patient in aftercare planning with referrals and resources, Facilitate patient progression through stages of change regarding substance use diagnoses and concerns and Identify triggers associated with mental health/substance abuse issues  Therapeutic Interventions: Assess for all discharge needs, 1 to 1 time with Social worker, Explore available resources and support systems, Assess for adequacy in community support network, Educate family and significant other(s) on suicide prevention, Complete Psychosocial Assessment, Interpersonal group therapy.  Evaluation of  Outcomes: Progressing   Progress in Treatment: Attending groups: No. New to unit. Continuing to assess.  Participating in groups: No. Taking medication as prescribed: Yes. Toleration medication: Yes. Family/Significant other contact made: No, will contact:  family member if patient consents Patient understands diagnosis: Yes. Discussing patient identified problems/goals with staff: Yes. Medical problems stabilized or resolved: Yes. Denies suicidal/homicidal ideation: Yes. Issues/concerns per patient self-inventory: Yes. Other: Patient continues to be somewhat bizarre/possibly delusional-may be substance induced.   New problem(s) identified: No, Describe:  n/a  New Short Term/Long Term Goal(s): medication stabilization; development of comprehensive mental wellness/sobriety plan.   Discharge Plan or Barriers: CSW assessing for appropriate referrals.  Reason for Continuation of Hospitalization: Delusions  Depression Medication stabilization  Withdrawals  Estimated Length of Stay: 3-5 days   Attendees: Patient: 04/20/2016 9:49 AM  Physician: Dr. Mckinley Jewelates MD 04/20/2016 9:49 AM  Nursing: Stacy GardnerPatty, Dan RN 04/20/2016 9:49 AM  RN Care Manager: Onnie BoerJennifer Clark CM 04/20/2016 9:49 AM  Social Worker: Trula SladeHeather Smart, LCSW 04/20/2016 9:49 AM  Recreational Therapist:  04/20/2016 9:49 AM  Other: Gray BernhardtMay Augustin NP; Claudette Headonrad Withrow NP 04/20/2016 9:49 AM  Other:  04/20/2016 9:49 AM  Other: 04/20/2016 9:49 AM    Scribe for Treatment Team: Ledell PeoplesHeather N Smart, LCSW 04/20/2016 9:49 AM

## 2016-04-20 NOTE — [facility] Suicide Risk Assessment (Signed)
[facility] INPATIENT:  Family/Significant Other Suicide Prevention Education  Suicide Prevention Education:  Education Completed; Nelida GoresLisa Salen, Pt's sister [phone],  has been identified by the patient as the family member/significant other with whom the patient will be residing, and identified as the person(s) who will aid the patient in the event of a mental health crisis (suicidal ideations/suicide attempt).  With written consent from the patient, the family member/significant other has been provided the following suicide prevention education, prior to the and/or following the discharge of the patient.  The suicide prevention education provided includes the following:  Suicide risk factors  Suicide prevention and interventions  National Suicide Hotline telephone number  [facility] assessment telephone number  [facility] Emergency Assistance 911  [facility] and/or [facility]idential [facility] Crisis [facility] telephone number  Request made of family/significant other to:  Remove weapons (e.g., guns, rifles, knives), all items previously/currently identified as safety concern.    Remove drugs/medications (over-the-counter, prescriptions, illicit drugs), all items previously/currently identified as a safety concern.  The family member/significant other verbalizes understanding of the suicide prevention education information provided.  The family member/significant other agrees to remove the items of safety concern listed above.  Verdene LennertLauren C Kenyona Rena 04/20/2016, 10:45 AM

## 2016-04-20 NOTE — H&P (Signed)
Psychiatric Admission Assessment Adult  Patient Identification: Christina Mccarty MRN:  [id number] Date of Evaluation:  04/20/2016 Chief Complaint:  SUBSTANCE INDUCED PSYCHOTIC DISORDER Principal Diagnosis: Bipolar 1 disorder, depressed (HCC) Diagnosis:   Patient Active Problem List   Diagnosis Date Noted  . 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 Marijuana dependence (HCC) [F12.20] 04/20/2016  . Substance-induced disorder (HCC) [F19.99] 04/19/2016  . Polysubstance abuse [F19.10] 04/19/2016  . Bipolar 1 disorder, depressed (HCC) [F31.9] 04/09/2016  . Polyp at cervical os [N84.1] 09/11/2012   History of Present Illness: Per admission records patient was found wandering confused in her yard bleeding from the neck. She related at the time and endorses now that she was hearing voices that she thought came from the cat telling her to kill her family and tried to kill herself to escape from the situation she made cuts superficially on her neck and also cut herself on her arm. Urine drug screen on admission was positive for amphetamines, methamphetamine and marijuana.  Patient states that she began hearing voices about 2 months ago and this is new for her. She states she has had about 2 episodes of hearing voices total. She reports that she first saw mental health a few years ago for depression which she states she experiences as hopelessness, isolating decreased sleep and decreased appetite and energy. She states that this started as a teenager. She reports previous trials of Prozac and Celexa in the past. She reports one prior inpatient psychiatric stay for 2 weeks at [facility] [place] There she was diagnosed with bipolar disorder and placed on Depakote however she did not continue this after release.  Patient does endorse what appear to be recurrent manic episodes which she states she felt were associated with her menstrual cycle. She would "pack up my bags" and "try to get  [place] [facility]" periodically. During that time she would spend any money that she had and would be grandiose and elevated in mood.  Patient reports she smokes "a lot" of marijuana but denies other substance use. She does state she has a prescription for Adderall and the [place] controlled substances reporting system shows that she has filled 2 prescriptions for Adderall  on 01/03/16 and one on 03/24/16 which was for 90 tablets of 20 mg Adderall listed as a 30 day supply. In the past 6 months she is also gotten 2 prescriptions for Percocets. And she does endorse that she has been taking her mother's opiate pain medication. She reports she takes about one pill a day. Patient denies using methamphetamines despite a positive urine drug screen. Collateral information from the family however suggests that she is minimizing her use.  Patient denies current delusions, auditory hallucinations, paranoia or ideas of reference although she does appear visibly hypervigilant. She denies any current suicidal or homicidal ideation, plan or intent.  Patient reports that she has lost 35 pounds this month and that her appetite is down. She reports that she became a vegetarian "to get me energy" at the beginning of 2017 and thinks it might be related to that. Lately she reports poor sleep as well. It is possible although she does not endorse at that this is related to her stimulant use.  Patient has congenital aortic stenosis first operated on at age 89. She has had a valve replacement in 1985 and another one in 1998. She states that this of prostetic valve does not require anticoagulation. She reports some decreased exercise tolerance but denies any  other symptoms. Associated Signs/Symptoms: Depression Symptoms:  hopelessness, weight loss, decreased appetite, (Hypo) Manic Symptoms:  Financial Extravagance, Grandiosity, Hallucinations, Impulsivity, Anxiety Symptoms:  Excessive Worry, Psychotic Symptoms:   Delusions, Hallucinations: Command:  to kill family Paranoia, PTSD Symptoms: Negative Total Time spent with patient: 45 minutes  Past Psychiatric History: see HPI  Is the patient at risk to self? Yes.    Has the patient been a risk to self in the past 6 months? Yes.    Has the patient been a risk to self within the distant past? Yes.    Is the patient a risk to others? No.  Has the patient been a risk to others in the past 6 months? No.  Has the patient been a risk to others within the distant past? No.   Prior Inpatient Therapy: Prior Inpatient Therapy: Yes Prior Therapy Dates: April 2017 Prior [facility] Reason for Treatment: Bipolar Prior Outpatient Therapy: Prior Outpatient Therapy: No Does patient have an ACCT team?: No Does patient have Intensive In-House Services?  : No Does patient have [facility] services? : No Does patient have P4CC services?: No  Alcohol Screening: 1. How often do you have a drink containing alcohol?: Monthly or less 2. How many drinks containing alcohol do you have on a typical day when you are drinking?: 1 or 2 3. How often do you have six or more drinks on one occasion?: Never Preliminary Score: 0 9. Have you or someone else been injured as a result of your drinking?: No 10. Has a relative or friend or a doctor or another health worker been concerned about your drinking or suggested you cut down?: No Alcohol Use Disorder Identification Test Final Score (AUDIT): 1 Brief Intervention: AUDIT score less than 7 or less-screening does not suggest unhealthy drinking-brief intervention not indicated Substance Abuse History in the last 12 months:  Yes.   Consequences of Substance Abuse: Medical Consequences:  Possible substance-induced psychosis Previous Psychotropic Medications: Yes  Psychological Evaluations: Yes  Past Medical History:  Past Medical History:  Diagnosis Date  . ADHD (attention deficit hyperactivity disorder)    . Bipolar disorder (HCC)   . Cervical polyp   . Congenital aortic stenosis   . Depression     Past Surgical History:  Procedure Laterality Date  . CARDIAC VALVE REPLACEMENT  1998  . CARDIAC VALVE REPLACEMENT     at age 89, 761985 and again in 761998   Family History:  Family History  Problem Relation Age of Onset  . Heart disease Father   . Other Father     tumor on thymus removed   Family Psychiatric  History: none known Tobacco Screening: Have you used any form of tobacco in the last 30 days? (Cigarettes, Smokeless Tobacco, Cigars, and/or Pipes): No Social History:  History  Alcohol Use  . Yes    Comment: only once per month     History  Drug Use  . Types: Marijuana    Additional Social History: Marital status: Divorced Divorced, when?: 2001 What types of issues is patient dealing with in the relationship?: "I think he's deceased" Does patient have children?: No    Pain Medications: denies Prescriptions: denies Over the Counter: denies History of alcohol / drug use?: Yes Longest period of sobriety (when/how long): unk Withdrawal Symptoms:  (denies) Name of Substance 1: Marijuana 1 - Age of First Use: unknown 1 - Amount (size/oz): "a lot" 1 - Frequency: daily 1 - Duration: years 1 - Last Use /  Amount: unsure Name of Substance 2: Oxycodone 2 - Age of First Use: 44 2 - Amount (size/oz): 2 (7.5mg  pills or 10mg  pills) 2 - Frequency: daily 2 - Duration: 6 years 2 - Last Use / Amount: "I don't remember"                Allergies:  No Known Allergies Lab Results: No results found for this or any previous visit (from the past 48 hour(s)).  Blood Alcohol level:  Lab Results  Component Value Date   ETH <5 04/08/2016    Metabolic Disorder Labs:  No results found for: HGBA1C, MPG No results found for: PROLACTIN No results found for: CHOL, TRIG, HDL, CHOLHDL, VLDL, LDLCALC  Current Medications: Current Facility-Administered Medications  Medication Dose  Route Frequency Provider Last Rate Last Dose  . acetaminophen (TYLENOL) tablet 650 mg  650 mg Oral Q6H PRN Laveda Abbe, NP      . alum & mag hydroxide-simeth (MAALOX/MYLANTA) 200-200-20 MG/5ML suspension 30 mL  30 mL Oral Q4H PRN Laveda Abbe, NP      . dicyclomine (BENTYL) tablet 20 mg  20 mg Oral Q6H PRN Jackelyn Poling, NP      . hydrOXYzine (ATARAX/VISTARIL) tablet 25 mg  25 mg Oral Q6H PRN Laveda Abbe, NP   25 mg at 04/19/16 2211  . ibuprofen (ADVIL,MOTRIN) tablet 600 mg  600 mg Oral Q6H PRN Jackelyn Poling, NP      . lamoTRIgine (LAMICTAL) tablet 25 mg  25 mg Oral Daily Jackelyn Poling, NP   25 mg at 04/20/16 0821  . loperamide (IMODIUM) capsule 2-4 mg  2-4 mg Oral PRN Jackelyn Poling, NP      . loratadine (CLARITIN) tablet 10 mg  10 mg Oral Daily Jackelyn Poling, NP   10 mg at 04/20/16 0820  . LORazepam (ATIVAN) tablet 1 mg  1 mg Oral Q6H PRN Jackelyn Poling, NP   1 mg at 04/19/16 2306  . magnesium hydroxide (MILK OF MAGNESIA) suspension 30 mL  30 mL Oral Daily PRN Laveda Abbe, NP      . methocarbamol (ROBAXIN) tablet 500 mg  500 mg Oral Q8H PRN Jackelyn Poling, NP      . ondansetron (ZOFRAN-ODT) disintegrating tablet 4 mg  4 mg Oral Q6H PRN Jackelyn Poling, NP      . QUEtiapine (SEROQUEL) tablet 100 mg  100 mg Oral QHS Acquanetta Sit, MD      . QUEtiapine (SEROQUEL) tablet 25 mg  25 mg Oral Q6H PRN Acquanetta Sit, MD      . traZODone (DESYREL) tablet 50 mg  50 mg Oral QHS PRN Laveda Abbe, NP   50 mg at 04/19/16 2211   PTA Medications: Prescriptions Prior to Admission  Medication Sig Dispense Refill Last Dose  . amphetamine-dextroamphetamine (ADDERALL) 20 MG tablet Take 10 mg by mouth every morning.    Unknown at Unknown  . HYDROcodone-acetaminophen (NORCO) 7.5-325 MG tablet Take 1 tablet by mouth every 6 (six) hours as needed for moderate pain.   0 Unknown at Unknown  . ibuprofen (ADVIL,MOTRIN) 200 MG tablet Take 200 mg by mouth every 6 (six)  hours as needed for pain.   Unknown at Unknown  . lamoTRIgine (LAMICTAL) 25 MG tablet Take 25 mg by mouth daily.   Past Week at Unknown time  . lisinopril (PRINIVIL,ZESTRIL) 10 MG tablet Take 10 mg by mouth daily as needed (only take when  taking adderall).    Past Month at Unknown time  . loratadine (CLARITIN) 10 MG tablet Take 10 mg by mouth daily.   Unknown at Unknown    Musculoskeletal: Strength & Muscle Tone: within normal limits Gait & Station: normal Patient leans: N/A  Psychiatric Specialty Exam: Physical Exam bandages to cut on left arm scratching visible on left side of neck   ROS report some decreased exercise tolerance with walking uphill long-standing from her aortic stenosis   Blood pressure 105/74, pulse 85, temperature 99 F (37.2 C), temperature source Oral, resp. rate 16, height 5' 6.5" (1.689 m), weight 66.2 kg (146 lb).Body mass index is 23.21 kg/m.  General Appearance: Disheveled  Eye Contact:  Fair  Speech:  Clear and Coherent  Volume:  Normal  Mood:  Anxious and Possibly paranoid  Affect:  Congruent  Thought Process:  Coherent  Orientation:  Negative  Thought Content:  Hallucinations: Auditory  Suicidal Thoughts:  No  Homicidal Thoughts:  No  Memory:  Negative  Judgement:  Impaired  Insight:  Lacking  Psychomotor Activity:  Normal  Concentration:  Attention Span: Fair  Recall:  Fair  Fund of Knowledge:  Good  Language:  Good  Akathisia:  No  Handed:  Right  AIMS (if indicated):   0  Assets:  Resilience  ADL's:  Intact  Cognition:  WNL  Sleep:  Number of Hours: 6.25    Treatment Plan Summary: Daily contact with patient to assess and evaluate symptoms and progress in treatment, Medication management and Patient tends to minimize her substance use and her psychiatric history but does appear currently to possibly have bipolar disorder type I and hallucinations that may be related to amphetamine and methamphetamine use disorder. Patient will begin on  Seroquel 100 mg by mouth daily at bedtime with 25 mg by mouth Q6 hours when necessary for treatment of psychosis and mood lability area and patient was briefed on the side effects including tardive dyskinesia of Seroquel, the expected effects, rationale, alternatives and agrees to a trial of the medication at this time. Patient has a history of medication noncompliance and we will monitor for any issues with taking the medications. We will review current labs and order any necessary labs as well. Social work will meet with patient to explore options for follow-up after discharge.  Observation Level/Precautions:  15 minute checks  Laboratory:  see labs  Psychotherapy:    Medications:    Consultations:    Discharge Concerns:    Estimated LOS:  Other:     Physician Treatment Plan for Primary Diagnosis: Bipolar 1 disorder, depressed (HCC) Long Term Goal(s): Improvement in symptoms so as ready for discharge  Short Term Goals: Ability to disclose and discuss suicidal ideas, Ability to demonstrate self-control will improve and Compliance with prescribed medications will improve  Physician Treatment Plan for Secondary Diagnosis: Principal Problem:   Bipolar 1 disorder, depressed (HCC) Active Problems:   Amphetamine dependence (HCC)   Marijuana dependence (HCC)  Long Term Goal(s): Improvement in symptoms so as ready for discharge  Short Term Goals: Ability to identify changes in lifestyle to reduce recurrence of condition will improve, Ability to identify and develop effective coping behaviors will improve and Ability to identify triggers associated with substance abuse/mental health issues will improve  I certify that inpatient services furnished can reasonably be expected to improve the patient's condition.    Acquanetta Sit, MD 11/17/201712:10 PM
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
## 2016-04-20 NOTE — [facility] Suicide Risk Assessment (Signed)
[facility]   Nursing information obtained from:  Patient Demographic factors:  Caucasian, Unemployed Current Mental Status:  NA Loss Factors:  Decrease in vocational status, Decline in physical health, Financial problems / change in socioeconomic status Historical Factors:  NA Risk Reduction Factors:  Living with another person, especially a relative  Total Time spent with patient: 45 minutes Principal Problem: Bipolar 1 disorder, depressed (HCC) Diagnosis:   Patient Active Problem List   Diagnosis Date Noted  . 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 Marijuana dependence (HCC) [F12.20] 04/20/2016  . Substance-induced disorder (HCC) [F19.99] 04/19/2016  . Polysubstance abuse [F19.10] 04/19/2016  . Bipolar 1 disorder, depressed (HCC) [F31.9] 04/09/2016  . Polyp at cervical os [N84.1] 09/11/2012   Subjective Data: pt denies current suicidal or homicidal ideation, plan or intent.  Continued Clinical Symptoms:  Alcohol Use Disorder Identification Test Final Score (AUDIT): 1 The "Alcohol Use Disorders Identification Test", Guidelines for Use in Primary Care, Second Edition.  World Science writerHealth Organization [facility](WHO). Score between 0-7:  no or low risk or alcohol related problems. Score between 8-15:  moderate risk of alcohol related problems. Score between 16-19:  high risk of alcohol related problems. Score 20 or above:  warrants further diagnostic evaluation for alcohol dependence and treatment.   CLINICAL FACTORS:   Bipolar Disorder:   Depressive phase Alcohol/Substance Abuse/Dependencies More than one psychiatric diagnosis Currently Psychotic Previous Psychiatric Diagnoses and Treatments Medical Diagnoses and Treatments/Surgeries   Musculoskeletal: Strength & Muscle Tone: within normal limits Gait & Station: normal Patient leans: N/A  Psychiatric Specialty Exam: Physical Exam  ROS  Blood pressure 105/74, pulse 85, temperature 99 F (37.2  C), temperature source Oral, resp. rate 16, height 5' 6.5" (1.689 m), weight 66.2 kg (146 lb).Body mass index is 23.21 kg/m.   General Appearance: Disheveled  Eye Contact:  Fair  Speech:  Clear and Coherent  Volume:  Normal  Mood:  Anxious and Possibly paranoid  Affect:  Congruent  Thought Process:  Coherent  Orientation:  Negative  Thought Content:  Hallucinations: Auditory  Suicidal Thoughts:  No  Homicidal Thoughts:  No  Memory:  Negative  Judgement:  Impaired  Insight:  Lacking  Psychomotor Activity:  Normal  Concentration:  Attention Span: Fair  Recall:  Fair  Fund of Knowledge:  Good  Language:  Good  Akathisia:  No  Handed:  Right  AIMS (if indicated):   0  Assets:  Resilience  ADL's:  Intact  Cognition:  WNL  Sleep:  Number of Hours: 6.25     COGNITIVE FEATURES THAT CONTRIBUTE TO RISK:  Loss of executive function    SUICIDE RISK:   Moderate:  Frequent suicidal ideation with limited intensity, and duration, some specificity in terms of plans, no associated intent, good self-control, limited dysphoria/symptomatology, some risk factors present, and identifiable protective factors, including available and accessible social support.   PLAN OF CARE: see PAA  I certify that inpatient services furnished can reasonably be expected to improve the patient's condition.  Acquanetta SitElizabeth Woods Oates, MD 04/20/2016, 12:32 PM

## 2016-04-20 NOTE — Progress Notes (Signed)
D   Pt is paranoid and guarded    She did try to attend group but is having trouble concentrating    She appears to be responding to internal stimuli and is preoccupied with her thoughts    Her interactions with others are limited     She did have visitors this evening and the visit went well A     Verbal support given   Medications administered and effectiveness monitored    Q 15 min checks  R   Pt is safe and somewhat receptive to verbal support

## 2016-04-20 NOTE — [facility] Group Notes (Signed)
[facility] LCSW Group Therapy  04/20/2016 3:33 PM  Type of Therapy:  Group Therapy  Participation Level:  Did Not Attend-pt invited. Chose to remain in bed.   Summary of Progress/Problems: Feelings around Relapse. Group members discussed the meaning of relapse and shared personal stories of relapse, how it affected them and others, and how they perceived themselves during this time. Group members were encouraged to identify triggers, warning signs and coping skills used when facing the possibility of relapse. Social supports were discussed and explored in detail.  Christiane Sistare N Smart LCSW 04/20/2016, 3:33 PM

## 2016-04-21 DIAGNOSIS — Z87891 Personal history of nicotine dependence: Secondary | ICD-10-CM

## 2016-04-21 MED ORDER — QUETIAPINE FUMARATE 200 MG PO TABS
200.0000 mg | ORAL_TABLET | Freq: Every day | ORAL | Status: DC
  Administered 2016-04-22: 200 mg via ORAL
  Filled 2016-04-21 (×3): qty 1

## 2016-04-21 MED ORDER — FAMOTIDINE 20 MG PO TABS
20.0000 mg | ORAL_TABLET | Freq: Once | ORAL | Status: AC
  Administered 2016-04-21: 20 mg via ORAL
  Filled 2016-04-21 (×2): qty 1

## 2016-04-21 MED ORDER — QUETIAPINE FUMARATE ER 50 MG PO TB24
50.0000 mg | ORAL_TABLET | Freq: Once | ORAL | Status: AC
  Administered 2016-04-21: 50 mg via ORAL
  Filled 2016-04-21 (×2): qty 1

## 2016-04-21 NOTE — [facility] Group Notes (Signed)
[facility] Group Notes:  (Nursing/MHT/Case Management/Adjunct)  Date:  04/21/2016  Time:  2:29 PM  Type of Therapy:  Psychoeducational Skills  Participation Level:  Active  Participation Quality:  Appropriate  Affect:  Appropriate  Cognitive:  Appropriate  Insight:  Appropriate  Engagement in Group:  Engaged  Modes of Intervention:  Discussion  Summary of Progress/Problems: Pt did attend life skills group with the nurse.   Christina Mccarty, Christina Mccarty 04/21/2016, 2:29 PM

## 2016-04-21 NOTE — [facility] Group Notes (Signed)
Adult Group Therapy Note  Date:  04/21/2016  Time: 10:00AM-11:00AM  Group Topic/Focus: Today's group focused on the topic of fear and healthy coping skills.  Common sources of fear and common problems were discussed.  After a brief discussion of each, an unhealthy coping skill and suggestions for healthy coping skills to deal with that fear/problem were named.  These were also listed on the whiteboard.  Commonalities were pointed out.  Each patient then chose a quote to share with the group, talked about how their quote spoke to them for their own life.  Participation Level:  Active  Participation Quality:  Attentive and Resistant  Affect:  Flat  Cognitive:  Disorganized  Insight: Limited  Engagement in Group:  Limited  Modes of Intervention:  Activity, Discussion and Support  Additional Comments:  The patient expressed that her biggest problem is self-criticism, and said the thing she most often tells herself is "I'm a perpetual f--- up."  She participated little and was somewhat irritable each time she was called on.  Carloyn JaegerMareida J Grossman-Orr 04/21/2016 , 11:52 AM

## 2016-04-21 NOTE — Progress Notes (Signed)
Patient ID: Baron SaneKimberly Mccarty, female   DOB: Apr 10, 1966, 50 y.o.   MRN: [id number]    D: Pt has been very flat and depressed on the unit today, she has also been very tearful. Pt reported that she needed help and that she needed Pepcid as soon as possible. Pt reported that she was not going to take any more medication until she got Pepcid. This Clinical research associatewriter spoke to doctor order given to give a one time dose of Pepcid. After patient was given Pepcid, she reported that staff was cheating her out of his rights. Pt reported that she needed a roommate ASAP, then became upset when she did not get one. Pt remained very tearful and would not talk to staff. Pt then reported that her room number was bothering her and that she needed another room. Pt reported that her depression was a 4, her hopelessness was a 4, and her anxiety was a 4. Pt reported that her goal for today was to stay awake. Pt reported being negative SI/HI, no AH/VH noted. A: 15 min checks continued for patient safety. R: Pt safety maintained.

## 2016-04-21 NOTE — [facility] Group Notes (Signed)
[facility] Group Notes:  (Nursing/MHT/Case Management/Adjunct)  Date:  04/21/2016  Time:  10:27 AM  Type of Therapy:  Psychoeducational Skills  Participation Level:  Minimal  Participation Quality:  Resistant  Affect:  Labile  Cognitive:  Disorganized  Insight:  Lacking  Engagement in Group:  Lacking  Modes of Intervention:  Problem-solving  Summary of Progress/Problems: Pt attended patient self inventory group.  Jacquelyne BalintForrest, Adalbert Alberto Shanta 04/21/2016, 10:27 AM

## 2016-04-21 NOTE — Progress Notes (Signed)
D   Pt is paranoid and guarded    She did try to attend group but is having trouble concentrating    She appears to be responding to internal stimuli and is preoccupied with her thoughts    Her interactions with others are limited     She did have visitors this evening and the visit went well    Pt did not want her seroquel tonight she said it kept her up last night A     Verbal support given   Medications administered and effectiveness monitored    Q 15 min checks  R   Pt is safe and somewhat receptive to verbal support

## 2016-04-21 NOTE — Progress Notes (Signed)
Patient did attend the evening speaker AA meeting.  

## 2016-04-21 NOTE — Progress Notes (Signed)
[facility]  04/21/2016 10:56 AM Christina Mccarty  MRN:  [id number] Subjective:  Patient reports " I don't feel safe in a room by myself. I don't like the room number." I don't feel like I have any rights here."  Objective: Christina Mccarty is awake, alert and oriented X3, seen attending group session.  Denies suicidal or homicidal ideation. Denies auditory or visual hallucination and does not appear to be responding to internal stimuli. Patient reports she was up all night and then can  remember why she attempted to hurt herself.  Patient reports she is medication compliant without mediation side effects. Patient denies withdrawal symptoms.  Patient denies depression or depressive symptoms. Reports I just want to go home to be with my family. Patient states "I am not feeling well today. 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  Support, encouragement and reassurance was provided.   Principal Problem: Bipolar 1 disorder, depressed (HCC) Diagnosis:   Patient Active Problem List   Diagnosis Date Noted  . 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 Marijuana dependence (HCC) [F12.20] 04/20/2016  . Substance-induced disorder (HCC) [F19.99] 04/19/2016  . Polysubstance abuse [F19.10] 04/19/2016  . Bipolar 1 disorder, depressed (HCC) [F31.9] 04/09/2016  . Polyp at cervical os [N84.1] 09/11/2012   Total Time spent with patient: 45 minutes  Past Psychiatric History:   Past Medical History:  Past Medical History:  Diagnosis Date  . ADHD (attention deficit hyperactivity disorder)   . Bipolar disorder (HCC)   . Cervical polyp   . Congenital aortic stenosis   . Depression     Past Surgical History:  Procedure Laterality Date  . CARDIAC VALVE REPLACEMENT  1998  . CARDIAC VALVE REPLACEMENT     at age 89, 581985 and again in [id number]   Family History:   Family History  Problem Relation Age of Onset  . Heart disease Father   . Other Father     tumor on thymus removed   Family Psychiatric  History:  Social History:  History  Alcohol Use  . Yes    Comment: only once per month     History  Drug Use  . Types: Marijuana    Social History   Social History  . Marital status: Single    Spouse name: N/A  . Number of children: N/A  . Years of education: N/A   Social History Main Topics  . Smoking status: Former Smoker    Types: Cigarettes  . Smokeless tobacco: Never Used     Comment: Stopped smoking age 50  . Alcohol use Yes     Comment: only once per month  . Drug use:     Types: Marijuana  . Sexual activity: Yes    Birth control/ protection: Condom   Other Topics Concern  . None   Social History Narrative  . None   Additional Social History:    Pain Medications: denies Prescriptions: denies Over the Counter: denies History of alcohol / drug use?: Yes Longest period of sobriety (when/how long): unk Withdrawal Symptoms:  (denies) Name of Substance 1: Marijuana 1 - Age of First Use: unknown 1 - Amount (size/oz): "a lot" 1 - Frequency: daily 1 - Duration: years 1 - Last Use / Amount: unsure Name of Substance 2: Oxycodone 2 - Age of First Use: 44 2 - Amount (size/oz): 2 (  7.5mg  pills or 10mg  pills) 2 - Frequency: daily 2 - Duration: 6 years 2 - Last Use / Amount: "I don't remember"                Sleep: Fair  Appetite:  Fair  Current Medications: Current Facility-Administered Medications  Medication Dose Route Frequency Provider Last Rate Last Dose  . acetaminophen (TYLENOL) tablet 650 mg  650 mg Oral Q6H PRN Laveda AbbeLaurie Britton Parks, NP      . alum & mag hydroxide-simeth (MAALOX/MYLANTA) 200-200-20 MG/5ML suspension 30 mL  30 mL Oral Q4H PRN Laveda AbbeLaurie Britton Parks, NP   30 mL at 04/20/16 2143  . dicyclomine (BENTYL) tablet 20 mg  20 mg Oral Q6H PRN Jackelyn PolingJason A Berry, NP      . hydrOXYzine (ATARAX/VISTARIL)  tablet 25 mg  25 mg Oral Q6H PRN Laveda AbbeLaurie Britton Parks, NP   25 mg at 04/19/16 2211  . ibuprofen (ADVIL,MOTRIN) tablet 600 mg  600 mg Oral Q6H PRN Jackelyn PolingJason A Berry, NP      . lamoTRIgine (LAMICTAL) tablet 25 mg  25 mg Oral Daily Jackelyn PolingJason A Berry, NP   25 mg at 04/21/16 0805  . loperamide (IMODIUM) capsule 2-4 mg  2-4 mg Oral PRN Jackelyn PolingJason A Berry, NP      . loratadine (CLARITIN) tablet 10 mg  10 mg Oral Daily Jackelyn PolingJason A Berry, NP   10 mg at 04/21/16 0805  . LORazepam (ATIVAN) tablet 1 mg  1 mg Oral Q6H PRN Jackelyn PolingJason A Berry, NP   1 mg at 04/19/16 2306  . magnesium hydroxide (MILK OF MAGNESIA) suspension 30 mL  30 mL Oral Daily PRN Laveda AbbeLaurie Britton Parks, NP      . methocarbamol (ROBAXIN) tablet 500 mg  500 mg Oral Q8H PRN Jackelyn PolingJason A Berry, NP      . ondansetron (ZOFRAN-ODT) disintegrating tablet 4 mg  4 mg Oral Q6H PRN Jackelyn PolingJason A Berry, NP      . QUEtiapine (SEROQUEL) tablet 100 mg  100 mg Oral QHS Acquanetta SitElizabeth Woods Oates, MD   100 mg at 04/20/16 2143  . QUEtiapine (SEROQUEL) tablet 25 mg  25 mg Oral Q6H PRN Acquanetta SitElizabeth Woods Oates, MD      . traZODone (DESYREL) tablet 50 mg  50 mg Oral QHS PRN Laveda AbbeLaurie Britton Parks, NP   50 mg at 04/19/16 2211    Lab Results: No results found for this or any previous visit (from the past 48 hour(s)).  Blood Alcohol level:  Lab Results  Component Value Date   ETH <5 04/08/2016    Metabolic Disorder Labs: No results found for: HGBA1C, MPG No results found for: PROLACTIN No results found for: CHOL, TRIG, HDL, CHOLHDL, VLDL, LDLCALC  Physical Findings: AIMS: Facial and Oral Movements Muscles of Facial Expression: None, normal Lips and Perioral Area: None, normal Jaw: None, normal Tongue: None, normal,Extremity Movements Upper (arms, wrists, hands, fingers): None, normal Lower (legs, knees, ankles, toes): None, normal, Trunk Movements Neck, shoulders, hips: None, normal, Overall Severity Severity of abnormal movements (highest score from questions above): None,  normal Incapacitation due to abnormal movements: None, normal Patient's awareness of abnormal movements (rate only patient's report): No Awareness, Dental Status Current problems with teeth and/or dentures?: No Does patient usually wear dentures?: No  CIWA:    COWS:  COWS Total Score: 7  Musculoskeletal: Strength & Muscle Tone: within normal limits Gait & Station: normal Patient leans: N/A  Psychiatric Specialty Exam: Physical Exam  Nursing note and vitals reviewed. Constitutional: She  is oriented to person, place, and time. She appears well-nourished.  Cardiovascular: Normal rate.   Neurological: She is alert and oriented to person, place, and time.  Skin: Skin is warm and dry.    Review of Systems  Psychiatric/Behavioral: Positive for depression. The patient is nervous/anxious.     Blood pressure (!) 143/84, pulse 93, temperature 98.1 F (36.7 C), resp. rate 15, height 5' 6.5" (1.689 m), weight 66.2 kg (146 lb).Body mass index is 23.21 kg/m.  General Appearance: Guarded flat and tearful   Eye Contact:  Fair  Speech:  Clear and Coherent  Volume:  Normal  Mood:  Anxious and Irritable  Affect:  Congruent  Thought Process:  Coherent and Linear  Orientation:  Full (Time, Place, and Person)  Thought Content:  Hallucinations: None, Paranoid Ideation and Rumination  Suicidal Thoughts:  No  Homicidal Thoughts:  No  Memory:  Immediate;   Fair Recent;   Fair Remote;   Fair  Judgement:  Fair  Insight:  Present  Psychomotor Activity:  Restlessness  Concentration:  Concentration: Fair  Recall:  [employer] of Knowledge:  Fair  Language:  Fair  Akathisia:  No  Handed:  Right  AIMS (if indicated):     Assets:  Desire for Improvement Resilience  ADL's:  Intact  Cognition:  WNL  Sleep:  Number of Hours: 5.5     I agree with current treatment plan on 04/21/2016, Patient seen face-to-face for psychiatric evaluation follow-up, chart reviewed and case discussed with the MD Bing Duffey.  Reviewed the information documented and agree with the treatment plan.  Treatment Plan Summary: Daily contact with patient to assess and evaluate symptoms and progress in treatment and Medication management  Increased  Seroquel 100 mg to 200 mg PO QHS, NOW Seroquel 50 mg. (agitation) Lamictal 25 mgs for mood stabilization. Continue with Trazodone 50 mg for insomnia  Will continue to monitor vitals ,medication compliance and treatment side effects while patient is here.  Reviewed labs  CSW will start working on disposition.  Patient to participate in therapeutic milieu  Oneta Rack, NP 04/21/2016, 10:56 AM   Agree with NP Progress Note as above

## 2016-04-22 NOTE — Progress Notes (Signed)
Patient ID: Christina Mccarty, female   DOB: 09/17/1965, 50 y.o.   MRN: [id number]   Pt signed a 72 hour request for discharge on 04/22/16 @1000 . [name] RN

## 2016-04-22 NOTE — [facility] Group Notes (Signed)
Adult Therapy Group Note  Date: 04/22/2016  Time:  10:00-11:00AM  Group Topic/Focus: Healthy Press photographerupport Systems   Building Self Esteem:   The focus of this group was to assist patients in identifying their current healthy supports and unhealthy supports, then discussing how to add additional healthy supports and reduce existing unhealthy ones.  The healthy additions included supports such as 12-step groups, individual therapy, psychiatrists, faith activities,  sponsors, group therapy, support groups, classes on mental health, and more.    Participation Level:  Active  Participation Quality:  Attentive and Sharing  Affect:  Blunted and Depressed  Cognitive:  Appropriate  Insight: Good  Engagement in Group:  Engaged  Modes of Intervention:  Discussion and Support  Additional Comments:  The patient expressed that a current health support is her sister, and unhealthy supports include her mother who is focused on herself only and uses prescription drugs and father who has no emotional availability.  For her, the attempts she has made to attend Narcotics Anonymous have been negative because of the language used there, specifically language such as "I'm a drug addict."  She is willing to add group therapy to increase her supports.  Lynnell ChadMareida J Grossman-Orr, LCSW 04/22/2016  12:12 PM

## 2016-04-22 NOTE — Progress Notes (Signed)
Patient ID: Christina Mccarty, female   DOB: [birth year], 50 y.o.   MRN: [id number]   D: Pt has been very flat and depressed on the unit today. Pt did attend all groups and engaged in treatment, she reported that she just wanted to be discharged. Pt was seen by the Tanika NP, and reported that she wanted to leave. Pt then requested to sign a 72 request for discharge, which was signed at 10am. Pt reported that her depression was a 1, her hopelessness was a 1, and her anxiety was a 1. Pt reported that he r goal for today was to get discharged. Pt reported being negative SI/HI, no AH/VH noted. A: 15 min checks continued for patient safety. R: Pt safety maintained.

## 2016-04-22 NOTE — Progress Notes (Signed)
D   Pt affect has improved and her mood has improved as well    She was smiling and did not have that pained worried expression on her face   She has been more social and her thoughts more normal A   Verbal support given   Medications administered and effectiveness monitored   Q 15 min checks R  Pt is safe at present and receptive to verbal support

## 2016-04-22 NOTE — Progress Notes (Signed)
Patient did attend the evening speaker AA meeting.  

## 2016-04-22 NOTE — [facility] Group Notes (Signed)
[facility] Group Notes:  (Nursing/MHT/Case Management/Adjunct)  Date:  04/22/2016  Time:  9:17 AM  Type of Therapy:  Psychoeducational Skills  Participation Level:  Active  Participation Quality:  Appropriate  Affect:  Appropriate  Cognitive:  Appropriate  Insight:  Appropriate  Engagement in Group:  Engaged  Modes of Intervention:  Discussion  Summary of Progress/Problems: Pt did attend self inventory group.     Jacquelyne BalintForrest, Mayer Vondrak Shanta 04/22/2016, 9:17 AM

## 2016-04-22 NOTE — Progress Notes (Signed)
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  Objective: Christina Mccarty is awake, alert and oriented X3, seen attending group session.  Denies suicidal or homicidal ideation. Denies auditory or visual hallucination and does not appear to be responding to internal stimuli. Patient still appears flat and guarded. however patient  is reporting she feels "fine" and is ready to leave. Patient reports attending group session.  Patient reports she is medication compliant without mediation side effects. Patient continues to denies depression or depressive symptoms.  Patient reports a fair appetite and states she is not resting well.  Support, encouragement  and reassurance was provided.   Principal Problem: Bipolar 1 disorder, depressed (HCC) Diagnosis:   Patient Active Problem List   Diagnosis Date Noted  . 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 Marijuana dependence (HCC) [F12.20] 04/20/2016  . Substance-induced disorder (HCC) [F19.99] 04/19/2016  . Polysubstance abuse [F19.10] 04/19/2016  . Bipolar 1 disorder, depressed (HCC) [F31.9] 04/09/2016  . Polyp at cervical os [N84.1] 09/11/2012   Total Time spent with patient: 45 minutes  Past Psychiatric History:   Past Medical History:  Past Medical History:  Diagnosis Date  . ADHD (attention deficit hyperactivity disorder)   . Bipolar disorder (HCC)   . Cervical polyp   . Congenital aortic stenosis   . Depression     Past Surgical History:  Procedure Laterality Date  . CARDIAC VALVE REPLACEMENT  1998  . CARDIAC VALVE REPLACEMENT     at age 89, 351985 and again in 371998   Family History:  Family History  Problem Relation Age of Onset  . Heart disease Father   . Other Father     tumor on  thymus removed   Family Psychiatric  History:  Social History:  History  Alcohol Use  . Yes    Comment: only once per month     History  Drug Use  . Types: Marijuana    Social History   Social History  . Marital status: Single    Spouse name: N/A  . Number of children: N/A  . Years of education: N/A   Social History Main Topics  . Smoking status: Former Smoker    Types: Cigarettes  . Smokeless tobacco: Never Used     Comment: Stopped smoking age 50  . Alcohol use Yes     Comment: only once per month  . Drug use:     Types: Marijuana  . Sexual activity: Yes    Birth control/ protection: Condom   Other Topics Concern  . None   Social History Narrative  . None   Additional Social History:    Pain Medications: denies Prescriptions: denies Over the Counter: denies History of alcohol / drug use?: Yes Longest period of sobriety (when/how long): unk Withdrawal Symptoms:  (denies) Name of Substance 1: Marijuana 1 - Age of First Use: unknown 1 - Amount (size/oz): "a lot" 1 - Frequency: daily 1 - Duration: years 1 - Last Use / Amount: unsure Name of Substance 2: Oxycodone 2 - Age of First Use: 44 2 - Amount (size/oz): 2 (7.5mg  pills or 10mg  pills) 2 - Frequency: daily 2 - Duration: 6 years 2 - Last Use / Amount: "I don't remember"  Sleep: Fair  Appetite:  Fair  Current Medications: Current Facility-Administered Medications  Medication Dose Route Frequency Provider Last Rate Last Dose  . acetaminophen (TYLENOL) tablet 650 mg  650 mg Oral Q6H PRN Laveda Abbe, NP      . alum & mag hydroxide-simeth (MAALOX/MYLANTA) 200-200-20 MG/5ML suspension 30 mL  30 mL Oral Q4H PRN Laveda Abbe, NP   30 mL at 04/20/16 2143  . dicyclomine (BENTYL) tablet 20 mg  20 mg Oral Q6H PRN Jackelyn Poling, NP   20 mg at 04/21/16 1831  . hydrOXYzine (ATARAX/VISTARIL) tablet 25 mg  25 mg Oral Q6H PRN Laveda Abbe, NP   25 mg at 04/19/16 2211   . ibuprofen (ADVIL,MOTRIN) tablet 600 mg  600 mg Oral Q6H PRN Jackelyn Poling, NP      . lamoTRIgine (LAMICTAL) tablet 25 mg  25 mg Oral Daily Jackelyn Poling, NP   25 mg at 04/22/16 0759  . loperamide (IMODIUM) capsule 2-4 mg  2-4 mg Oral PRN Jackelyn Poling, NP      . loratadine (CLARITIN) tablet 10 mg  10 mg Oral Daily Jackelyn Poling, NP   10 mg at 04/22/16 0759  . LORazepam (ATIVAN) tablet 1 mg  1 mg Oral Q6H PRN Jackelyn Poling, NP   1 mg at 04/21/16 2146  . magnesium hydroxide (MILK OF MAGNESIA) suspension 30 mL  30 mL Oral Daily PRN Laveda Abbe, NP      . methocarbamol (ROBAXIN) tablet 500 mg  500 mg Oral Q8H PRN Jackelyn Poling, NP      . ondansetron (ZOFRAN-ODT) disintegrating tablet 4 mg  4 mg Oral Q6H PRN Jackelyn Poling, NP      . QUEtiapine (SEROQUEL) tablet 200 mg  200 mg Oral QHS Oneta Rack, NP      . QUEtiapine (SEROQUEL) tablet 25 mg  25 mg Oral Q6H PRN Acquanetta Sit, MD      . traZODone (DESYREL) tablet 50 mg  50 mg Oral QHS PRN Laveda Abbe, NP   50 mg at 04/19/16 2211    Lab Results: No results found for this or any previous visit (from the past 48 hour(s)).  Blood Alcohol level:  Lab Results  Component Value Date   ETH <5 04/08/2016    Metabolic Disorder Labs: No results found for: HGBA1C, MPG No results found for: PROLACTIN No results found for: CHOL, TRIG, HDL, CHOLHDL, VLDL, LDLCALC  Physical Findings: AIMS: Facial and Oral Movements Muscles of Facial Expression: None, normal Lips and Perioral Area: None, normal Jaw: None, normal Tongue: None, normal,Extremity Movements Upper (arms, wrists, hands, fingers): None, normal Lower (legs, knees, ankles, toes): None, normal, Trunk Movements Neck, shoulders, hips: None, normal, Overall Severity Severity of abnormal movements (highest score from questions above): None, normal Incapacitation due to abnormal movements: None, normal Patient's awareness of abnormal movements (rate only patient's  report): No Awareness, Dental Status Current problems with teeth and/or dentures?: No Does patient usually wear dentures?: No  CIWA:    COWS:  COWS Total Score: 0  Musculoskeletal: Strength & Muscle Tone: within normal limits Gait & Station: normal Patient leans: N/A  Psychiatric Specialty Exam: Physical Exam  Nursing note and vitals reviewed. Constitutional: She is oriented to person, place, and time. She appears well-nourished.  Cardiovascular: Normal rate.   Neurological: She is alert and oriented to person, place, and time.  Skin: Skin is warm and dry.    Review  of Systems  Psychiatric/Behavioral: Positive for depression. The patient is nervous/anxious.     Blood pressure 108/85, pulse 84, temperature 98.1 F (36.7 C), resp. rate 16, height 5' 6.5" (1.689 m), weight 66.2 kg (146 lb).Body mass index is 23.21 kg/m.  General Appearance: Guarded and  flat  Eye Contact:  Fair  Speech:  Clear and Coherent  Volume:  Normal  Mood:  Anxious and Irritable  Affect:  Congruent  Thought Process:  Coherent and Linear  Orientation:  Full (Time, Place, and Person)  Thought Content:  Hallucinations: None, Paranoid Ideation and Rumination  Suicidal Thoughts:  No  Homicidal Thoughts:  No  Memory:  Immediate;   Fair Recent;   Fair Remote;   Fair  Judgement:  Fair  Insight:  Present  Psychomotor Activity:  Restlessness  Concentration:  Concentration: Fair  Recall:  [employer]  Fund of Knowledge:  Fair  Language:  Fair  Akathisia:  No  Handed:  Right  AIMS (if indicated):     Assets:  Desire for Improvement Resilience  ADL's:  Intact  Cognition:  WNL  Sleep:  Number of Hours: 5.5     I agree with current treatment plan on 04/22/2016, Patient seen face-to-face for psychiatric evaluation follow-up, chart reviewed and case discussed with the MD Cobos. Reviewed the information documented and agree with the treatment plan.  Treatment Plan Summary: Daily contact with patient to assess  and evaluate symptoms and progress in treatment and Medication management  Continue Seroquel  200 mg PO QHS, NOW Seroquel 50 mg. (agitation) Lamictal 25 mgs for mood stabilization. discontinue with Trazodone 50 mg for insomnia Will continue to monitor vitals ,medication compliance and treatment side effects while patient is here.  Reviewed labs  CSW will start working on disposition.  Patient to participate in therapeutic milieu  Oneta Rackanika N Lewis, NP 04/22/2016, 11:19 AM   Agree with NP Progress Note as above

## 2016-04-23 MED ORDER — QUETIAPINE FUMARATE 50 MG PO TABS
150.0000 mg | ORAL_TABLET | Freq: Every day | ORAL | Status: DC
  Administered 2016-04-23: 150 mg via ORAL
  Filled 2016-04-23 (×3): qty 1

## 2016-04-23 NOTE — BHH Suicide Risk Assessment (Signed)
BHH Discharge Suicide Risk Assessment   Principal Problem: Bipolar 1 disorder, depressed [facility] Discharge Diagnoses:  Patient Active Problem List   Diagnosis Date Noted  . 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 Marijuana dependence (HCC) [F12.20] 04/20/2016  . Substance-induced disorder (HCC) [F19.99] 04/19/2016  . Polysubstance abuse [F19.10] 04/19/2016  . Bipolar 1 disorder, depressed (HCC) [F31.9] 04/09/2016  . Polyp at cervical os [N84.1] 09/11/2012    Total Time spent with patient: 15 minutes  Musculoskeletal: Strength & Muscle Tone: within normal limits Gait & Station: normal Patient leans: N/A  Psychiatric Specialty Exam: ROS  Blood pressure (!) 96/59, pulse 80, temperature 98.6 F (37 C), resp. rate 20, height 5' 6.5" (1.689 m), weight 66.2 kg (146 lb).Body mass index is 23.21 kg/m.  General Appearance: Casual  Eye Contact::  Fair  Speech:  Clear and Coherent409  Volume:  Normal  Mood:  Anxious  Affect:  Congruent  Thought Process:  Coherent  Orientation:  Negative  Thought Content:  Negative  Suicidal Thoughts:  No  Homicidal Thoughts:  No  Memory:  Negative  Judgement:  Impaired  Insight:  Present  Psychomotor Activity:  Normal  Concentration:  Good  Recall:  Good  Fund of Knowledge:Good  Language: Good  Akathisia:  No  Handed:  Right  AIMS (if indicated):   0  Assets:  Resilience  Sleep:  Number of Hours: 6.25  Cognition: WNL  ADL's:  Intact   Mental Status Per Nursing Assessment::   On Admission:  NA  Demographic Factors:  Unemployed  Loss Factors: Financial problems/change in socioeconomic status  Historical Factors: NA  Risk Reduction Factors:   Living with another person, especially a relative  Continued Clinical Symptoms:  Bipolar Disorder:   Depressive phase Alcohol/Substance Abuse/Dependencies More than one psychiatric diagnosis Unstable or Poor Therapeutic Relationship Previous Psychiatric Diagnoses and  Treatments Medical Diagnoses and Treatments/Surgeries  Cognitive Features That Contribute To Risk:  Closed-mindedness    Suicide Risk:  Moderate:  Frequent suicidal ideation with limited intensity, and duration, some specificity in terms of plans, no associated intent, good self-control, limited dysphoria/symptomatology, some risk factors present, and identifiable protective factors, including available and accessible social support.  Follow-up Information    [facility] Follow up.   Why:  Walk in between 8am-11am Monday through Friday for hospital follow-up/medication management. Please go within 48 hours of discharge from the hospital if possible in order to be set up with outpatient services.  Contact information: [address] [place], [place] [address] Phone: [phone] Fax: [phone]          Plan Of Care/Follow-up recommendations:  Other:  Patient denies any acute psychotic symptoms, and she denies any suicidal or homicidal ideation, plan or intent and does not appear to meet any IVC criteria at this time. She does request discharge and is placed a 72 hour letter yesterday. At this time she will be discharged per her request due to lack of acute dangerousness to self or others. She is encouraged to follow up with outpatient providers and continue her psychiatric medications.  Acquanetta SitElizabeth Woods Savino Whisenant, MD 04/23/2016, 3:06 PM

## 2016-04-23 NOTE — Progress Notes (Signed)
CSW met with pt individually today to discuss aftercare plan. Patient stated that she is moving to [facility] from here and has a job lined up. SHe plans to stay with her sister until she can secure an apt in that area. Pt was open to medication management at [facility] in [place] "But I"ll find my own therapist and pay out of pocket." CSW provided pt with a comprehensive list of private therapists that specialize in this pt's needs/diagnosis. Per Dr. Sharolyn Douglas, pt is tentatively scheduled for discharge on Tuesday afternoon. "My sister will pick me up."   Maxie Better, MSW, LCSW Clinical Social Worker 04/23/2016 2:55 PM

## 2016-04-23 NOTE — Progress Notes (Signed)
Recreation Therapy Notes  Date: 04/23/16 Time: 0930 Location: 300 [address] Dayroom  Group Topic: Stress Management  Goal Area(s) Addresses:  Patient will verbalize importance of using healthy stress management.  Patient will identify positive emotions associated with healthy stress management.   Behavioral Response: Engaged  Intervention: Calm App  Activity :  Managing Stress Meditation.  LRT introduced the stress management technique of stress management.  LRT played a recorded meditation on stress and how to deal with it.  Patients were to follow along with the recording and focus on their breathing and concentration to participate in the technique.  Education:  Stress Management, Discharge Planning.   Education Outcome: Acknowledges edcuation/In group clarification offered/Needs additional education  Clinical Observations/Feedback: Pt attended group.    Caroll RancherMarjette Raeleen Winstanley, LRT/CTRS      Caroll RancherLindsay, Ardell Makarewicz A 04/23/2016 11:43 AM

## 2016-04-23 NOTE — Progress Notes (Signed)
Psychoeducational Group Note  Date:  04/23/2016 Time:  2326  Group Topic/Focus:  Wrap-Up Group:   The focus of this group is to help patients review their daily goal of treatment and discuss progress on daily workbooks.   Participation Level: Did Not Attend  Participation Quality:  Not Applicable  Affect:  Not Applicable  Cognitive:  Not Applicable  Insight:  Not Applicable  Engagement in Group: Not Applicable  Additional Comments:  The patient did not attend the A.A. Meeting this evening since she remained in her room.  Hazle CocaGOODMAN, Eldo Umanzor S 04/23/2016, 11:26 PM

## 2016-04-23 NOTE — Progress Notes (Signed)
[facility]  04/23/2016 2:00 PM  Patient Active Problem List   Diagnosis Date Noted  . Amphetamine dependence (HCC) 04/20/2016  . Marijuana dependence (HCC) 04/20/2016  . Substance-induced disorder (HCC) 04/19/2016  . Polysubstance abuse 04/19/2016  . Bipolar 1 disorder, depressed (HCC) 04/09/2016  . Polyp at cervical os 09/11/2012    Diagnosis:Bipolar type I disorder, last episode depressed methamphetamine use disorder  Subjective: Patient was initially quite cheerful and pleasant but then became upset when informed that we did not feel she was appropriate for discharge today. She did place a 72 hour letter requesting discharge at 10 AM yesterday. She reports that Seroquel was increased on Saturday but she feels the 200 mg at night is too strong. She does deny current auditory hallucinations, or suicidal or homicidal ideation plan or intent. 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 Social work has reported the family is willing for the patient to come live with them.  Objective: Well-developed well-nourished woman in no apparent distress with healing scratches on her throat. She remains a bit labile and mood and affect, thought processes are linear and goal directed thought content denies any auditory hallucinations, suicidal or homicidal ideation, plan or intent she is alert and oriented 3, insight and judgment are quite limited in terms of understanding the need for further treatment and she minimizes her experiences and symptoms, IQ appears an average range       Current Facility-Administered Medications (Respiratory):  .  loratadine (CLARITIN) tablet 10 mg   Current Facility-Administered Medications (Analgesics):  .  acetaminophen (TYLENOL) tablet 650 mg .  ibuprofen (ADVIL,MOTRIN) tablet 600 mg     Current Facility-Administered Medications (Other):  .  alum &  mag hydroxide-simeth (MAALOX/MYLANTA) 200-200-20 MG/5ML suspension 30 mL .  dicyclomine (BENTYL) tablet 20 mg .  hydrOXYzine (ATARAX/VISTARIL) tablet 25 mg .  lamoTRIgine (LAMICTAL) tablet 25 mg .  loperamide (IMODIUM) capsule 2-4 mg .  magnesium hydroxide (MILK OF MAGNESIA) suspension 30 mL .  methocarbamol (ROBAXIN) tablet 500 mg .  ondansetron (ZOFRAN-ODT) disintegrating tablet 4 mg .  QUEtiapine (SEROQUEL) tablet 150 mg .  QUEtiapine (SEROQUEL) tablet 25 mg  No current outpatient prescriptions on file.  Vital Signs:Blood pressure (!) 96/59, pulse 80, temperature 98.6 F (37 C), resp. rate 20, height 5' 6.5" (1.689 m), weight 66.2 kg (146 lb).    Lab Results: No results found for this or any previous visit (from the past 48 hour(s)).  Physical Findings: AIMS: Facial and Oral Movements Muscles of Facial Expression: None, normal Lips and Perioral Area: None, normal Jaw: None, normal Tongue: None, normal,Extremity Movements Upper (arms, wrists, hands, fingers): None, normal Lower (legs, knees, ankles, toes): None, normal, Trunk Movements Neck, shoulders, hips: None, normal, Overall Severity Severity of abnormal movements (highest score from questions above): None, normal Incapacitation due to abnormal movements: None, normal Patient's awareness of abnormal movements (rate only patient's report): No Awareness, Dental Status Current problems with teeth and/or dentures?: No Does patient usually wear dentures?: No  CIWA:    COWS:  COWS Total Score: 0   Assessment/Plan: Patient does not currently appear to meet any criteria for involuntary commitment against her will as she denies any suicidal or homicidal ideation, plan or intent and does not exhibit current psychotic symptoms such as paranoia or voices. She does appear improved from her admission last week. Chronically it does appear she  lacks insight into the nature of its her illness, her substance use and its treatment. She will  be encouraged to stay with follow-up for her issues. Family is supportive and is willing to have patient stay with them and provide her with employment. Plan is to discharge patient later in the day tomorrow or early Wednesday if present course continuous. Reduce Seroquel to 150 mg by mouth daily at bedtime and monitor effectiveness.  Acquanetta SitElizabeth Woods Aaradhya Kysar, MD 04/23/2016, 2:00 PM

## 2016-04-23 NOTE — Progress Notes (Signed)
D: Pt denies SI/HI/AVH. Pt is irritable and isolates to room. Pt stayed in bed upon approach, pt only answered yes, no and forwarded little information to Clinical research associatewriter.   A: Pt was offered support and encouragement. Pt was given scheduled medications. Pt was encourage to attend groups. Q 15 minute checks were done for safety.   R: safety maintained on unit.

## 2016-04-23 NOTE — Plan of Care (Signed)
Problem: Coping: Goal: Ability to cope will improve Outcome: Progressing Pt denies SI at this time   

## 2016-04-23 NOTE — BHH Group Notes (Signed)
Pt attended spiritual care group on grief and loss facilitated by chaplain Burnis KingfisherMatthew Alisah Grandberry   Group opened with brief discussion and psycho-social ed around grief and loss in relationships and in relation to self - identifying life patterns, circumstances, changes that cause losses. Established group norm of speaking from own life experience. Group goal of establishing open and affirming space for members to share loss and experience with grief, normalize grief experience and provide psycho social education and grief support.    Cala BradfordKimberly was present throughout group.  She was quiet and observant for majority of group.  When facilitator checked in with other group members who had not spoken, she related that she had a very difficult year.  Detailed that "things have gone downhill since April."   Related she had undergone treatment under care of psychiatrist in the past and "was placed on a lot of medicines" - stated this made her feel blunted and unlike herself, so she ceased taking them.  Was not specific about time frames.    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  Expresses that she "just wants to go home to be with family and to ride my bike."    Regarding self-injury, Cala BradfordKimberly related that "others did not notice how much pain I was in.  I didn't want to kill myself, but I was isolated and it didn't seem like things were going to change."  Reports "others notice now," but stated she feels judgment from her father, stating "sometimes you do things that just won't go away."

## 2016-04-23 NOTE — Progress Notes (Signed)
Pt asked for nurse. "Dr. Mckinley Jewelates said these stitches could come out tomorrow at urgent care. But these stitches need to come out tonight." Pt was told that they could not be removed without the order. "Alright, then, you can document the infection." Though some limited erythema is noted around the stitches, the site does not appear infected. No drainage or swelling present. Will monitor and report to oncoming shift.

## 2016-04-23 NOTE — Progress Notes (Signed)
D: Christina Mccarty denied SI, HI, and AVH. She appeared somewhat aloof at a.m med pass. When this writer spoke with pt to do an EKG, pt was cross (making some remarks about the number of meds she's taking and medical resources she's consuming) but cooperative. Pt has not appeared to be responding to internal stimuli. She expressed concern about her stitches and if they could be removed (she said MD said tomorrow). A: Meds given as ordered. Q15 safety checks maintained. Support/encouragement offered. R: Pt remains free from harm and continues with treatment. Will continue to monitor for needs/safety.

## 2016-04-23 NOTE — BHH Group Notes (Signed)
B[facility] LCSW Group Therapy  04/23/2016 2:06 PM  Type of Therapy:  Group Therapy  Participation Level:  Did Not Attend-pt invited. Chose to remain in bed.   Summary of Progress/Problems: Today's Topic: Overcoming Obstacles. Patients identified one short term goal and potential obstacles in reaching this goal. Patients processed barriers involved in overcoming these obstacles. Patients identified steps necessary for overcoming these obstacles and explored motivation (internal and external) for facing these difficulties head on.   Zahirah Cheslock N Smart LCSW 04/23/2016, 2:06 PM

## 2016-04-23 NOTE — Plan of Care (Signed)
Problem: Safety: Goal: Ability to remain free from injury will improve Outcome: Progressing Pt has not had any self-injury since admission.

## 2016-04-24 LAB — LIPID PANEL
CHOL/HDL RATIO: 2.9 ratio
CHOLESTEROL: 166 mg/dL (ref 0–200)
HDL: 58 mg/dL (ref >40)
LDL Cholesterol: 85 mg/dL (ref 0–99)
Triglycerides: 117 mg/dL (ref <150)
VLDL: 23 mg/dL (ref 0–40)

## 2016-04-24 LAB — TSH: TSH: 8.275 u[IU]/mL — ABNORMAL HIGH (ref 0.350–4.500)

## 2016-04-24 MED ORDER — QUETIAPINE FUMARATE 50 MG PO TABS: 150.0000 mg | ORAL_TABLET | Freq: Every day | ORAL | 0 refills | Status: AC

## 2016-04-24 MED ORDER — QUETIAPINE FUMARATE 50 MG PO TABS
150.0000 mg | ORAL_TABLET | Freq: Every day | ORAL | Status: DC
  Filled 2016-04-24: qty 21

## 2016-04-24 MED ORDER — HYDROXYZINE HCL 25 MG PO TABS: 25.0000 mg | ORAL_TABLET | Freq: Four times a day (QID) | ORAL | 0 refills | Status: AC | PRN

## 2016-04-24 MED ORDER — QUETIAPINE FUMARATE 25 MG PO TABS: 25.0000 mg | ORAL_TABLET | Freq: Four times a day (QID) | ORAL | 0 refills | Status: AC | PRN

## 2016-04-24 MED ORDER — LAMOTRIGINE 25 MG PO TABS: 25.0000 mg | ORAL_TABLET | Freq: Every day | ORAL | 0 refills | Status: AC

## 2016-04-24 NOTE — Tx Team (Signed)
Interdisciplinary Treatment and Diagnostic Plan Update  04/24/2016 Time of Session: 9:30AM Christina Mccarty MRN: [id number]  Principal Diagnosis: Bipolar 1 disorder, depressed ([place])  Secondary Diagnoses: Principal Problem:   Bipolar 1 disorder, depressed ([place]) Active Problems:   Amphetamine dependence ([place])   Marijuana dependence ([place])   Current Medications:  Current Facility-Administered Medications  Medication Dose Route Frequency Provider Last Rate Last Dose  . acetaminophen (TYLENOL) tablet 650 mg  650 mg Oral Q6H PRN Ethelene Hal, NP      . alum & mag hydroxide-simeth (MAALOX/MYLANTA) 200-200-20 MG/5ML suspension 30 mL  30 mL Oral Q4H PRN Ethelene Hal, NP   30 mL at 04/23/16 2147  . dicyclomine (BENTYL) tablet 20 mg  20 mg Oral Q6H PRN Rozetta Nunnery, NP   20 mg at 04/24/16 0757  . hydrOXYzine (ATARAX/VISTARIL) tablet 25 mg  25 mg Oral Q6H PRN Ethelene Hal, NP   25 mg at 04/19/16 2211  . ibuprofen (ADVIL,MOTRIN) tablet 600 mg  600 mg Oral Q6H PRN Rozetta Nunnery, NP      . lamoTRIgine (LAMICTAL) tablet 25 mg  25 mg Oral Daily Rozetta Nunnery, NP   25 mg at 04/24/16 0757  . loperamide (IMODIUM) capsule 2-4 mg  2-4 mg Oral PRN Rozetta Nunnery, NP      . loratadine (CLARITIN) tablet 10 mg  10 mg Oral Daily Rozetta Nunnery, NP   10 mg at 04/24/16 0757  . magnesium hydroxide (MILK OF MAGNESIA) suspension 30 mL  30 mL Oral Daily PRN Ethelene Hal, NP      . methocarbamol (ROBAXIN) tablet 500 mg  500 mg Oral Q8H PRN Rozetta Nunnery, NP      . ondansetron (ZOFRAN-ODT) disintegrating tablet 4 mg  4 mg Oral Q6H PRN Rozetta Nunnery, NP      . QUEtiapine (SEROQUEL) tablet 150 mg  150 mg Oral QHS Linard Millers, MD   150 mg at 04/23/16 2147  . QUEtiapine (SEROQUEL) tablet 25 mg  25 mg Oral Q6H PRN Linard Millers, MD       PTA Medications: Prescriptions Prior to Admission  Medication Sig Dispense Refill Last Dose  . amphetamine-dextroamphetamine (ADDERALL) 20  MG tablet Take 10 mg by mouth every morning.    Unknown at Unknown  . HYDROcodone-acetaminophen (NORCO) 7.5-325 MG tablet Take 1 tablet by mouth every 6 (six) hours as needed for moderate pain.   0 Unknown at Unknown  . ibuprofen (ADVIL,MOTRIN) 200 MG tablet Take 200 mg by mouth every 6 (six) hours as needed for pain.   Unknown at Unknown  . lamoTRIgine (LAMICTAL) 25 MG tablet Take 25 mg by mouth daily.   Past Week at Unknown time  . lisinopril (PRINIVIL,ZESTRIL) 10 MG tablet Take 10 mg by mouth daily as needed (only take when taking adderall).    Past Month at Unknown time  . loratadine (CLARITIN) 10 MG tablet Take 10 mg by mouth daily.   Unknown at Unknown    Patient Stressors: Financial difficulties Health problems Marital or family conflict Occupational concerns Substance abuse Other: Taking care of her elderly mother with dementia  Patient Strengths: Average or above average intelligence Curator fund of knowledge  Treatment Modalities: Medication Management, Group therapy, Case management,  1 to 1 session with clinician, Psychoeducation, Recreational therapy.   Physician Treatment Plan for Primary Diagnosis: Bipolar 1 disorder, depressed ([place]) Long Term Goal(s): Improvement in symptoms so as ready for discharge Improvement  in symptoms so as ready for discharge   Short Term Goals: Ability to disclose and discuss suicidal ideas Ability to demonstrate self-control will improve Compliance with prescribed medications will improve Ability to identify changes in lifestyle to reduce recurrence of condition will improve Ability to identify and develop effective coping behaviors will improve Ability to identify triggers associated with substance abuse/mental health issues will improve  Medication Management: Evaluate patient's response, side effects, and tolerance of medication regimen.  Therapeutic Interventions: 1 to 1 sessions, Unit Group sessions and Medication  administration.  Evaluation of Outcomes: Met  Physician Treatment Plan for Secondary Diagnosis: Principal Problem:   Bipolar 1 disorder, depressed ([place]) Active Problems:   Amphetamine dependence ([place])   Marijuana dependence ([place])  Long Term Goal(s): Improvement in symptoms so as ready for discharge Improvement in symptoms so as ready for discharge   Short Term Goals: Ability to disclose and discuss suicidal ideas Ability to demonstrate self-control will improve Compliance with prescribed medications will improve Ability to identify changes in lifestyle to reduce recurrence of condition will improve Ability to identify and develop effective coping behaviors will improve Ability to identify triggers associated with substance abuse/mental health issues will improve     Medication Management: Evaluate patient's response, side effects, and tolerance of medication regimen.  Therapeutic Interventions: 1 to 1 sessions, Unit Group sessions and Medication administration.  Evaluation of Outcomes: Met   RN Treatment Plan for Primary Diagnosis: Bipolar 1 disorder, depressed ([place]) Long Term Goal(s): Knowledge of disease and therapeutic regimen to maintain health will improve  Short Term Goals: Ability to remain free from injury will improve, Ability to disclose and discuss suicidal ideas and Ability to identify and develop effective coping behaviors will improve  Medication Management: RN will administer medications as ordered by provider, will assess and evaluate patient's response and provide education to patient for prescribed medication. RN will report any adverse and/or side effects to prescribing provider.  Therapeutic Interventions: 1 on 1 counseling sessions, Psychoeducation, Medication administration, Evaluate responses to treatment, Monitor vital signs and CBGs as ordered, Perform/monitor CIWA, COWS, AIMS and Fall Risk screenings as ordered, Perform wound care treatments as  ordered.  Evaluation of Outcomes: Met   LCSW Treatment Plan for Primary Diagnosis: Bipolar 1 disorder, depressed ([place]) Long Term Goal(s): Safe transition to appropriate next level of care at discharge, Engage patient in therapeutic group addressing interpersonal concerns.  Short Term Goals: Engage patient in aftercare planning with referrals and resources, Facilitate patient progression through stages of change regarding substance use diagnoses and concerns and Identify triggers associated with mental health/substance abuse issues  Therapeutic Interventions: Assess for all discharge needs, 1 to 1 time with Social worker, Explore available resources and support systems, Assess for adequacy in community support network, Educate family and significant other(s) on suicide prevention, Complete Psychosocial Assessment, Interpersonal group therapy.  Evaluation of Outcomes: Adequate for discharge   Progress in Treatment: Attending groups: No. Pt isolated in her room throughout her stay.  Participating in groups: No. Taking medication as prescribed: Yes. Toleration medication: Yes. Family/Significant other contact made:Yes, SPE completed with pt's sister who will be both picking her up at discharge and allowing patient to live with her afterward.  Patient understands diagnosis: Yes. Discussing patient identified problems/goals with staff: Yes. Medical problems stabilized or resolved: Yes. Denies suicidal/homicidal ideation: Yes. Issues/concerns per patient self-inventory: No. Other:n/a   New problem(s) identified: No, Describe:  n/a  New Short Term/Long Term Goal(s): medication stabilization; development of comprehensive mental wellness/sobriety plan.  Discharge Plan or Barriers: Pt plans to move to [facility] with her sister and will be looking for her own apt in that area. Pt plans to follow-up with [facility] in [place] and wanted to find her own "private therapist. I will just pay out of  pocket." CSW printed a list of therapists in the [place] area specializing in [place] diagnosis and issues. Pt was encouraged to call to find her best fit.   Reason for Continuation of Hospitalization: none  Estimated Length of Stay: d/c today  Attendees: Patient: 04/24/2016 8:25 AM  Physician: Dr. Sharolyn Douglas MD 04/24/2016 8:25 AM  Nursing: Bertrum Sol RN 04/24/2016 8:25 AM  RN Care Manager: Lars Pinks CM 04/24/2016 8:25 AM  Social Worker: Press photographer, LCSW 04/24/2016 8:25 AM  Recreational Therapist:  04/24/2016 8:25 AM  Other: Samuel Jester NP; Catalina Pizza NP 04/24/2016 8:25 AM  Other:  04/24/2016 8:25 AM  Other: 04/24/2016 8:25 AM    Scribe for Treatment Team: Kimber Relic Smart, LCSW 04/24/2016 8:25 AM

## 2016-04-24 NOTE — Progress Notes (Signed)
  [facility] :  Will you be returning to the same living situation after discharge:  No. Pt will be staying with her sister in [place]  At discharge, do you have transportation home?: Yes,  sister Do you have the ability to pay for your medications: Yes,  medication management  Release of information consent forms completed and submitted to medical records by CSW.  Patient to Follow up at: Follow-up Information    [facility] Follow up.   Why:  Walk in between 8am-11am Monday through Friday for hospital follow-up/medication management. Please go within 48 hours of discharge from the hospital if possible in order to be set up with outpatient services.  Contact information: [address] [place], [place] [address] Phone: [phone] Fax: [phone]        Pt refused therapy referral and does not [facility]. Pt was provided with several private therapist options in [place] specializing in her diagnosis.   Next level of care provider [facility] Link:no  Safety Planning and Suicide Prevention discussed: Yes,  SPE completed with pt's sister. SPI pamphlet and Mobile Crisis information provided to pt and she was encouraged to share information with support network, ask questions, and talk about any concerns relating to SPE.  Have you used any form of tobacco in the last 30 days? (Cigarettes, Smokeless Tobacco, Cigars, and/or Pipes): No  Has patient been referred to the Quitline?: N/A patient is not a smoker  Patient has been referred for addiction treatment: Yes  Ledell PeoplesHeather N Smart LCSW 04/24/2016, 8:23 AM

## 2016-04-24 NOTE — Progress Notes (Signed)
Nursing Discharge Note 04/20/2016 [id number]  Data Reports sleeping well.  Did not complete self-inventory sheet. Denies HI, SI, AVH.  Attending groups, keeps to self in milieu, but appropriate with staff.  C/O cramping this morning, attributes it to Seroquel.  Received discharge orders.  Action Spoke with patient 1:1, nurse offered support to patient throughout shift. Given Bentyl for cramping, MD aware of cramping.  Charge RN Reviewed medications, discharge instructions, and follow up appointments with patient. Charge RN handed patient medication samples and scripts.  Paperwork, AVS, SRA, and transition record handed to patient.   Escorted off of unit at 1425. Belongings returned per belongings form.  Discharged to lobby .    Response Bentyl effective.  Agrees to contact someone or 911 with thoughts/intent to harm self or others.    To follow up per AVS.

## 2016-04-24 NOTE — Discharge Summary (Signed)
Physician Discharge Summary Note  Patient:  Christina Mccarty is an 50 y.o., female MRN:  [id number] DOB:  1965-06-23 Patient phone:  [phone] (home)  Patient address:   [address] [place] [address],  Total Time spent with patient: 45 minutes  Date of Admission:  04/19/2016 Date of Discharge: 04/24/16  Reason for Admission:   Per admission records patient was found wandering confused in her yard bleeding from the neck. She related at the time and endorses now that she was hearing voices that she thought came from the cat telling her to kill her family and tried to kill herself to escape from the situation she made cuts superficially on her neck and also cut herself on her arm. Urine drug screen on admission was positive for amphetamines, methamphetamine and marijuana.  Patient states that she began hearing voices about 2 months ago and this is new for her. She states she has had about 2 episodes of hearing voices total. She reports that she first saw mental health a few years ago for depression which she states she experiences as hopelessness, isolating decreased sleep and decreased appetite and energy. She states that this started as a teenager. She reports previous trials of Prozac and Celexa in the past. She reports one prior inpatient psychiatric stay for 2 weeks at [facility] in [place]. There she was diagnosed with bipolar disorder and placed on Depakote however she did not continue this after release.  Patient does endorse what appear to be recurrent manic episodes which she states she felt were associated with her menstrual cycle. She would "pack up my bags" and "try to get away from [facility]" periodically. During that time she would spend any money that she had and would be grandiose and elevated in mood.  Patient reports she smokes "a lot" of marijuana but denies other substance use. She does state she has a prescription for Adderall and the [place]  controlled substances reporting system shows that she has filled 2 prescriptions for Adderall  on 01/03/16 and one on 03/24/16 which was for 90 tablets of 20 mg Adderall listed as a 30 day supply. In the past 6 months she is also gotten 2 prescriptions for Percocets. And she does endorse that she has been taking her mother's opiate pain medication. She reports she takes about one pill a day. Patient denies using methamphetamines despite a positive urine drug screen. Collateral information from the family however suggests that she is minimizing her use.  Patient denies current delusions, auditory hallucinations, paranoia or ideas of reference although she does appear visibly hypervigilant. She denies any current suicidal or homicidal ideation, plan or intent.  Patient reports that she has lost 35 pounds this month and that her appetite is down. She reports that she became a vegetarian "to get me energy" at the beginning of 2017 and thinks it might be related to that. Lately she reports poor sleep as well. It is possible although she does not endorse at that this is related to her stimulant use.  Patient has congenital aortic stenosis first operated on at age 13. She has had a valve replacement in 1985 and another one in 1998. She states that this of prostetic valve does not require anticoagulation. She reports some decreased exercise tolerance but denies any other symptoms.  Principal Problem: Bipolar 1 disorder, depressed [facility]) Discharge Diagnoses: Patient Active Problem List   Diagnosis Date Noted  . 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 Marijuana dependence (  HCC) [F12.20] 04/20/2016  . Substance-induced disorder (HCC) [F19.99] 04/19/2016  . Polysubstance abuse [F19.10] 04/19/2016  . Bipolar 1 disorder, depressed (HCC) [F31.9] 04/09/2016  . Polyp at cervical os [N84.1] 09/11/2012    Past Psychiatric History: See H&P  Past Medical History:  Past Medical History:  Diagnosis Date   . ADHD (attention deficit hyperactivity disorder)   . Bipolar disorder (HCC)   . Cervical polyp   . Congenital aortic stenosis   . Depression     Past Surgical History:  Procedure Laterality Date  . CARDIAC VALVE REPLACEMENT  1998  . CARDIAC VALVE REPLACEMENT     at age 17, 53 and again in 62   Family History:  Family History  Problem Relation Age of Onset  . Heart disease Father   . Other Father     tumor on thymus removed   Family Psychiatric  History: See H&P Social History:  History  Alcohol Use  . Yes    Comment: only once per month     History  Drug Use  . Types: Marijuana    Social History   Social History  . Marital status: Single    Spouse name: N/A  . Number of children: N/A  . Years of education: N/A   Social History Main Topics  . Smoking status: Former Smoker    Types: Cigarettes  . Smokeless tobacco: Never Used     Comment: Stopped smoking age 63  . Alcohol use Yes     Comment: only once per month  . Drug use:     Types: Marijuana  . Sexual activity: Yes    Birth control/ protection: Condom   Other Topics Concern  . None   Social History Narrative  . None    Hospital Course:   Christina Mccarty was admitted for Bipolar 1 disorder, depressed (HCC) , with crisis management.  Pt was treated discharged with the medications listed below under Medication List.  Medical problems were identified and treated as needed.  Home medications were restarted as appropriate.  Improvement was monitored by observation and Christina Mccarty 's daily report of symptom reduction.  Emotional and mental status was monitored by daily self-inventory reports completed by Christina Mccarty and clinical staff.         Christina Mccarty was evaluated by the treatment team for stability and plans for continued recovery upon discharge. Christina Mccarty 's motivation was an integral factor for scheduling further treatment. Employment, transportation, bed availability, health  status, family support, and any pending legal issues were also considered during hospital stay. Pt was offered further treatment options upon discharge including but not limited to Residential, Intensive Outpatient, and Outpatient treatment.  Christina Mccarty will follow up with the services as listed below under Follow Up Information.     Upon completion of this admission the patient was both mentally and medically stable for discharge denying suicidal/homicidal ideation, auditory/visual/tactile hallucinations, delusional thoughts and paranoia.    Christina Mccarty responded well to treatment with vistaril, lamictal, seroquel without adverse effects. Pt demonstrated improvement without reported or observed adverse effects to the point of stability appropriate for outpatient management. Pertinent labs include: UDS+ amphetamines, THC,  and UA for UTI for which outpatient follow-up is necessary for lab recheck as mentioned below. Reviewed CBC, CMP, BAL, and UDS; all unremarkable aside from noted exceptions.    Physical Findings: AIMS: Facial and Oral Movements Muscles of Facial Expression: None, normal Lips and Perioral Area: None, normal Jaw: None, normal Tongue: None,  normal,Extremity Movements Upper (arms, wrists, hands, fingers): None, normal Lower (legs, knees, ankles, toes): None, normal, Trunk Movements Neck, shoulders, hips: None, normal, Overall Severity Severity of abnormal movements (highest score from questions above): None, normal Incapacitation due to abnormal movements: None, normal Patient's awareness of abnormal movements (rate only patient's report): No Awareness, Dental Status Current problems with teeth and/or dentures?: No Does patient usually wear dentures?: No  CIWA:    COWS:  COWS Total Score: 0  Musculoskeletal: Strength & Muscle Tone: within normal limits Gait & Station: normal Patient leans: N/A  Psychiatric Specialty Exam: Physical Exam  Review of Systems   Psychiatric/Behavioral: Positive for depression. Negative for hallucinations, substance abuse and suicidal ideas. The patient is nervous/anxious and has insomnia.   All other systems reviewed and are negative.   Blood pressure 105/71, pulse 85, temperature 98.6 F (37 Mccarty), temperature source Oral, resp. rate 16, height 5' 6.5" (1.689 m), weight 66.2 kg (146 lb).Body mass index is 23.21 kg/m.  SEE MD PSE WITHIN SRA  Have you used any form of tobacco in the last 30 days? (Cigarettes, Smokeless Tobacco, Cigars, and/or Pipes): No  Has this patient used any form of tobacco in the last 30 days? (Cigarettes, Smokeless Tobacco, Cigars, and/or Pipes) No  Blood Alcohol level:  Lab Results  Component Value Date   ETH <5 04/08/2016    Metabolic Disorder Labs:  No results found for: HGBA1C, MPG No results found for: PROLACTIN Lab Results  Component Value Date   CHOL 166 04/24/2016   TRIG 117 04/24/2016   HDL 58 04/24/2016   CHOLHDL 2.9 04/24/2016   VLDL 23 04/24/2016   LDLCALC 85 04/24/2016    See Psychiatric Specialty Exam and Suicide Risk Assessment completed by Attending Physician prior to discharge.  Discharge destination:  Home  Is patient on multiple antipsychotic therapies at discharge:  No   Has Patient had three or more failed trials of antipsychotic monotherapy by history:  No  Recommended Plan for Multiple Antipsychotic Therapies: NA     Medication List    STOP taking these medications   amphetamine-dextroamphetamine 20 MG tablet Commonly known as:  ADDERALL   HYDROcodone-acetaminophen 7.5-325 MG tablet Commonly known as:  NORCO   ibuprofen 200 MG tablet Commonly known as:  ADVIL,MOTRIN   lisinopril 10 MG tablet Commonly known as:  PRINIVIL,ZESTRIL     TAKE these medications     Indication  hydrOXYzine 25 MG tablet Commonly known as:  ATARAX/VISTARIL Take 1 tablet (25 mg total) by mouth every 6 (six) hours as needed for anxiety.  Indication:  Anxiety  Neurosis   lamoTRIgine 25 MG tablet Commonly known as:  LAMICTAL Take 1 tablet (25 mg total) by mouth daily. Start taking on:  04/25/2016  Indication:  mood stabilization   loratadine 10 MG tablet Commonly known as:  CLARITIN Take 10 mg by mouth daily.  Indication:  Hayfever   QUEtiapine 50 MG tablet Commonly known as:  SEROQUEL Take 3 tablets (150 mg total) by mouth at bedtime.  Indication:  mood stabilization   QUEtiapine 25 MG tablet Commonly known as:  SEROQUEL Take 1 tablet (25 mg total) by mouth every 6 (six) hours as needed (agitation or psychosis).  Indication:  severe anxiety/agitation      Follow-up Information    [facility] Follow up.   Why:  Walk in between 8am-11am Monday through Friday for hospital follow-up/medication management. Please go within 48 hours of discharge from the hospital if possible in order to be  set up with outpatient services.  Contact information: [address] [place], [place] [address] Phone: [phone] Fax: [phone]          Follow-up recommendations:  Activity:  As tolerated Diet:  Heart healthy with low sodium.  Comments:   Take all medications as prescribed. Keep all follow-up appointments as scheduled.  Do not consume alcohol or use illegal drugs while on prescription medications. Report any adverse effects from your medications to your primary care provider promptly.  In the event of recurrent symptoms or worsening symptoms, call 911, a crisis hotline, or go to the nearest emergency department for evaluation.    Signed: Beau FannyWithrow, Christina Delancey C, FNP 04/24/2016, 9:33 AM

## 2016-04-24 NOTE — [facility] Group Notes (Signed)
Adult Psychoeducational Group Note  Date:  04/24/2016 Time:  0900-0930  Group Topic/Focus:  Recovery Goals:   The focus of this group is to identify appropriate goals for recovery and establish a plan to achieve them.  Participation Level: Active & appropriate  Participation Quality:  Active & appropriate  Insight: Improved  Engagement in Group: Active & appropriate   Modes of Intervention:  Discussion, Education and Support  Additional Comments:  Patient spoke about being ready for discharge, stated her treatment goal was to remain on her medicines after discharge stating "I know I need them.  I don't know what happened these past couple months but I kept going in and out of being crazy.  Maybe I got a bad batch of weed, but I think I need to be on medicine anyway."   Lindajo RoyalDaniel P Kjersti Dittmer 04/24/2016, 10:35 AM

## 2016-04-25 LAB — HEMOGLOBIN A1C
Hgb A1c MFr Bld: 4.9 % (ref 4.8–5.6)
Mean Plasma Glucose: 94 mg/dL

## 2017-06-11 IMAGING — CR DG CHEST 1V PORT
1 series · 1 of 1 positions shown · non-contrast
Comparison: None.

CLINICAL DATA: Chest pain.  Hallucinations.  One day duration.

EXAM:
PORTABLE CHEST 1 VIEW

[AP]
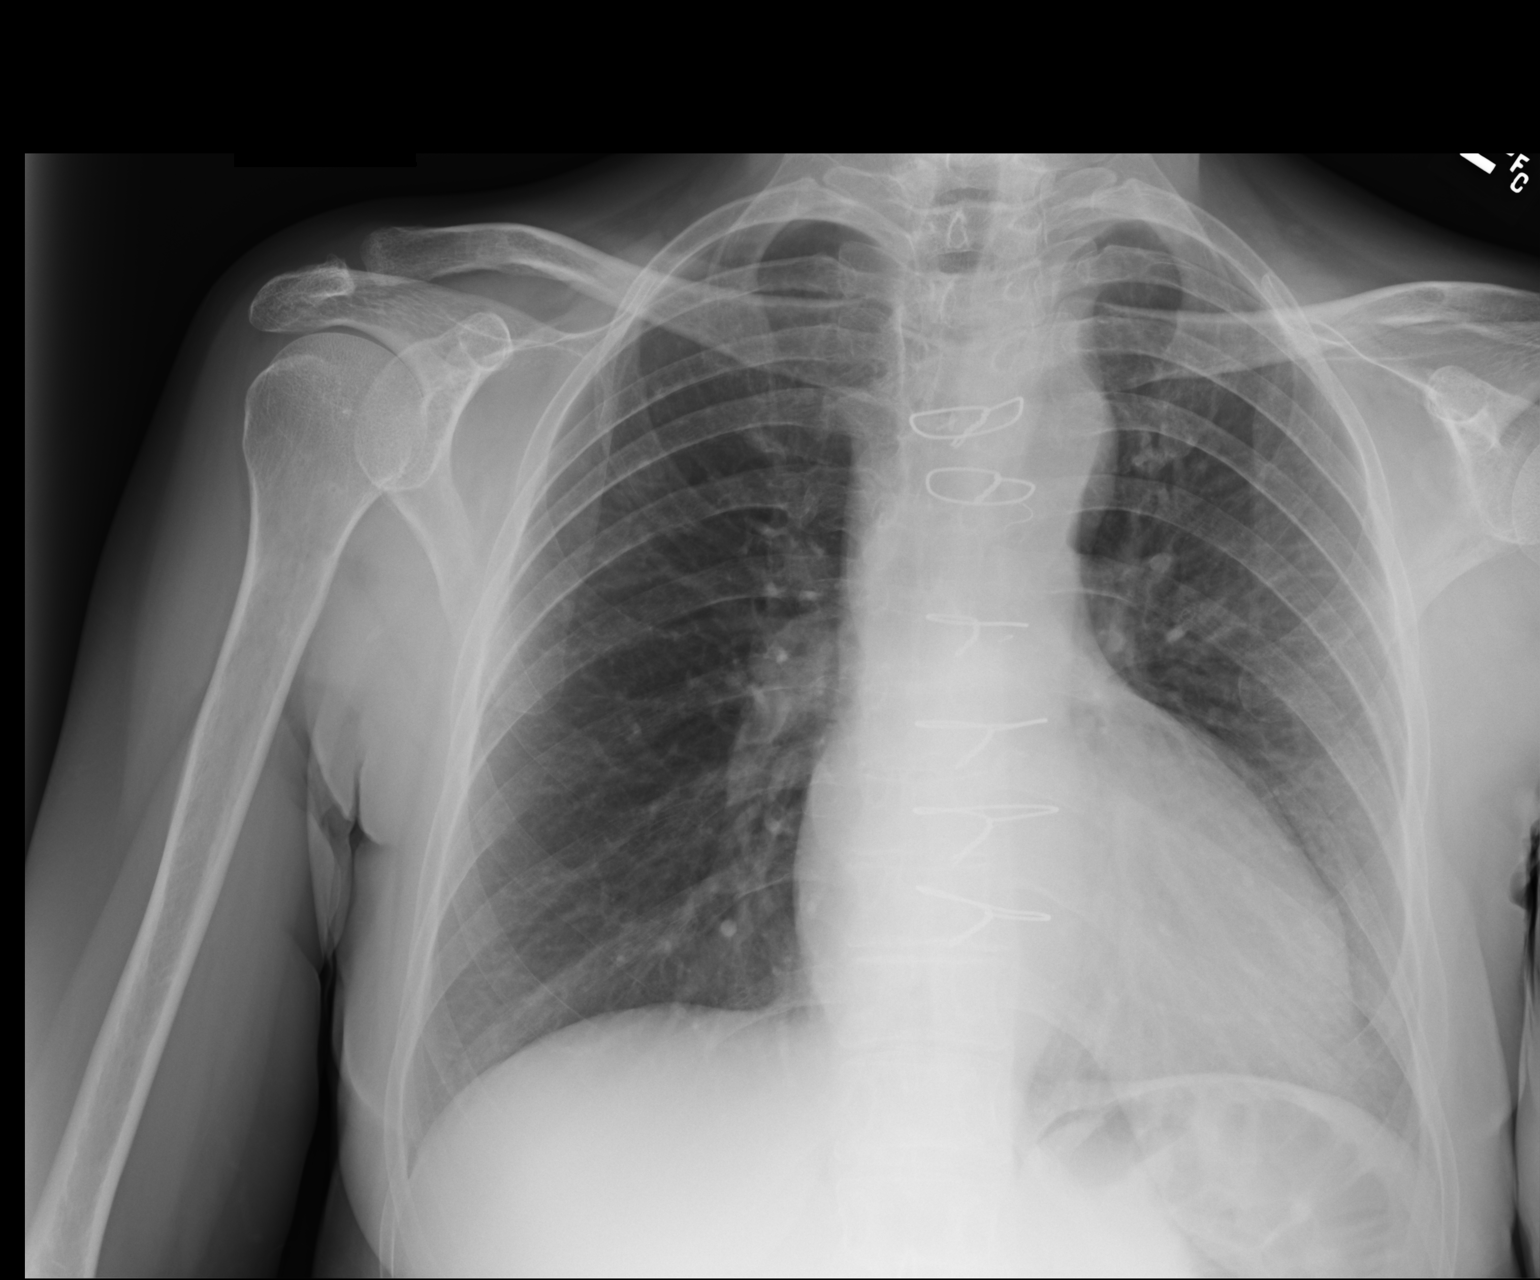

[1 of 1 positions shown; findings below may reference images not displayed]

FINDINGS: Mild cardiomegaly. The lungs are clear. The pulmonary vasculature is
normal. Hilar and mediastinal contours are unremarkable. No large
effusions.
IMPRESSION: Mild cardiomegaly.

## 2018-07-05 DEATH — deceased
